# Patient Record
Sex: Female | Born: 1960 | Race: White | Hispanic: No | Smoking: Never smoker
Health system: Southern US, Community
[De-identification: ages and names within clinical notes are randomized; demographics above are authoritative.]

## PROBLEM LIST (undated history)

## (undated) DIAGNOSIS — E559 Vitamin D deficiency, unspecified: Secondary | ICD-10-CM

## (undated) DIAGNOSIS — N951 Menopausal and female climacteric states: Secondary | ICD-10-CM

## (undated) DIAGNOSIS — N200 Calculus of kidney: Secondary | ICD-10-CM

## (undated) DIAGNOSIS — I499 Cardiac arrhythmia, unspecified: Secondary | ICD-10-CM

## (undated) DIAGNOSIS — C801 Malignant (primary) neoplasm, unspecified: Secondary | ICD-10-CM

## (undated) DIAGNOSIS — T8859XA Other complications of anesthesia, initial encounter: Secondary | ICD-10-CM

## (undated) DIAGNOSIS — E7800 Pure hypercholesterolemia, unspecified: Secondary | ICD-10-CM

## (undated) DIAGNOSIS — E119 Type 2 diabetes mellitus without complications: Secondary | ICD-10-CM

## (undated) DIAGNOSIS — I471 Supraventricular tachycardia, unspecified: Secondary | ICD-10-CM

## (undated) DIAGNOSIS — I10 Essential (primary) hypertension: Secondary | ICD-10-CM

## (undated) DIAGNOSIS — R1013 Epigastric pain: Secondary | ICD-10-CM

## (undated) DIAGNOSIS — T4145XA Adverse effect of unspecified anesthetic, initial encounter: Secondary | ICD-10-CM

## (undated) HISTORY — PX: KIDNEY STONE SURGERY: SHX686

## (undated) HISTORY — DX: Pure hypercholesterolemia, unspecified: E78.00

## (undated) HISTORY — DX: Type 2 diabetes mellitus without complications: E11.9

## (undated) HISTORY — PX: GANGLION CYST EXCISION: SHX1691

## (undated) HISTORY — DX: Epigastric pain: R10.13

## (undated) HISTORY — DX: Calculus of kidney: N20.0

## (undated) HISTORY — DX: Supraventricular tachycardia, unspecified: I47.10

## (undated) HISTORY — PX: MOHS SURGERY: SHX181

## (undated) HISTORY — DX: Vitamin D deficiency, unspecified: E55.9

## (undated) HISTORY — DX: Menopausal and female climacteric states: N95.1

## (undated) HISTORY — PX: OTHER SURGICAL HISTORY: SHX169

## (undated) HISTORY — DX: Supraventricular tachycardia: I47.1

## (undated) HISTORY — PX: LAPAROSCOPY: SHX197

---

## 1998-11-11 ENCOUNTER — Other Ambulatory Visit: Admission: RE | Admit: 1998-11-11 | Discharge: 1998-11-11 | Payer: Self-pay | Admitting: Obstetrics and Gynecology

## 1999-01-19 ENCOUNTER — Other Ambulatory Visit: Admission: RE | Admit: 1999-01-19 | Discharge: 1999-01-19 | Payer: Self-pay | Admitting: Orthopedic Surgery

## 1999-11-14 ENCOUNTER — Encounter: Payer: Self-pay | Admitting: Emergency Medicine

## 1999-11-15 ENCOUNTER — Observation Stay (HOSPITAL_COMMUNITY): Admission: EM | Admit: 1999-11-15 | Discharge: 1999-11-15 | Payer: Self-pay | Admitting: Emergency Medicine

## 2000-05-19 ENCOUNTER — Other Ambulatory Visit: Admission: RE | Admit: 2000-05-19 | Discharge: 2000-05-19 | Payer: Self-pay | Admitting: Obstetrics and Gynecology

## 2000-05-23 ENCOUNTER — Encounter: Admission: RE | Admit: 2000-05-23 | Discharge: 2000-05-23 | Payer: Self-pay | Admitting: Obstetrics and Gynecology

## 2000-05-23 ENCOUNTER — Encounter: Payer: Self-pay | Admitting: Obstetrics and Gynecology

## 2001-11-01 ENCOUNTER — Encounter: Payer: Self-pay | Admitting: Obstetrics and Gynecology

## 2001-11-01 ENCOUNTER — Encounter: Admission: RE | Admit: 2001-11-01 | Discharge: 2001-11-01 | Payer: Self-pay | Admitting: Obstetrics and Gynecology

## 2002-02-13 ENCOUNTER — Encounter: Admission: RE | Admit: 2002-02-13 | Discharge: 2002-02-13 | Payer: Self-pay | Admitting: Obstetrics and Gynecology

## 2002-02-13 ENCOUNTER — Encounter: Payer: Self-pay | Admitting: Obstetrics and Gynecology

## 2002-03-08 ENCOUNTER — Other Ambulatory Visit: Admission: RE | Admit: 2002-03-08 | Discharge: 2002-03-08 | Payer: Self-pay | Admitting: Obstetrics and Gynecology

## 2003-07-24 ENCOUNTER — Other Ambulatory Visit: Admission: RE | Admit: 2003-07-24 | Discharge: 2003-07-24 | Payer: Self-pay | Admitting: Obstetrics and Gynecology

## 2003-07-29 ENCOUNTER — Encounter: Admission: RE | Admit: 2003-07-29 | Discharge: 2003-07-29 | Payer: Self-pay | Admitting: Obstetrics and Gynecology

## 2004-08-24 ENCOUNTER — Other Ambulatory Visit: Admission: RE | Admit: 2004-08-24 | Discharge: 2004-08-24 | Payer: Self-pay | Admitting: Obstetrics and Gynecology

## 2004-09-03 ENCOUNTER — Ambulatory Visit (HOSPITAL_COMMUNITY): Admission: RE | Admit: 2004-09-03 | Discharge: 2004-09-03 | Payer: Self-pay | Admitting: Obstetrics and Gynecology

## 2005-10-26 ENCOUNTER — Encounter: Admission: RE | Admit: 2005-10-26 | Discharge: 2005-10-26 | Payer: Self-pay | Admitting: Obstetrics and Gynecology

## 2005-10-26 ENCOUNTER — Other Ambulatory Visit: Admission: RE | Admit: 2005-10-26 | Discharge: 2005-10-26 | Payer: Self-pay | Admitting: Obstetrics and Gynecology

## 2006-11-21 ENCOUNTER — Encounter: Admission: RE | Admit: 2006-11-21 | Discharge: 2006-11-21 | Payer: Self-pay | Admitting: Obstetrics and Gynecology

## 2008-04-08 ENCOUNTER — Encounter: Admission: RE | Admit: 2008-04-08 | Discharge: 2008-04-08 | Payer: Self-pay | Admitting: Obstetrics and Gynecology

## 2009-04-18 ENCOUNTER — Encounter: Admission: RE | Admit: 2009-04-18 | Discharge: 2009-04-18 | Payer: Self-pay | Admitting: Obstetrics and Gynecology

## 2009-04-29 ENCOUNTER — Encounter: Admission: RE | Admit: 2009-04-29 | Discharge: 2009-04-29 | Payer: Self-pay | Admitting: Obstetrics and Gynecology

## 2009-07-30 ENCOUNTER — Inpatient Hospital Stay (HOSPITAL_COMMUNITY): Admission: RE | Admit: 2009-07-30 | Discharge: 2009-08-03 | Payer: Self-pay | Admitting: Urology

## 2010-05-24 ENCOUNTER — Encounter: Payer: Self-pay | Admitting: Obstetrics and Gynecology

## 2010-05-24 ENCOUNTER — Encounter: Payer: Self-pay | Admitting: Urology

## 2010-07-22 LAB — BASIC METABOLIC PANEL
BUN: 9 mg/dL (ref 6–23)
CO2: 28 mEq/L (ref 19–32)
CO2: 31 mEq/L (ref 19–32)
Calcium: 7.9 mg/dL — ABNORMAL LOW (ref 8.4–10.5)
Chloride: 103 mEq/L (ref 96–112)
GFR calc non Af Amer: >60 mL/min (ref >60)

## 2010-07-22 LAB — HEMOGLOBIN AND HEMATOCRIT, BLOOD
HCT: 35.7 % — ABNORMAL LOW (ref 36.0–46.0)
HCT: 38.1 % (ref 36.0–46.0)
Hemoglobin: 12.1 g/dL (ref 12.0–15.0)

## 2010-07-27 LAB — PREGNANCY, URINE: Preg Test, Ur: NEGATIVE

## 2010-07-27 LAB — BASIC METABOLIC PANEL
BUN: 11 mg/dL (ref 6–23)
CO2: 28 mEq/L (ref 19–32)
CO2: 32 mEq/L (ref 19–32)
Calcium: 8 mg/dL — ABNORMAL LOW (ref 8.4–10.5)
Chloride: 106 mEq/L (ref 96–112)
Chloride: 107 mEq/L (ref 96–112)
Creatinine, Ser: 0.66 mg/dL (ref 0.4–1.2)
Creatinine, Ser: 0.7 mg/dL (ref 0.4–1.2)
Creatinine, Ser: 0.71 mg/dL (ref 0.4–1.2)
GFR calc Af Amer: >60 mL/min (ref >60)
GFR calc Af Amer: >60 mL/min (ref >60)
GFR calc non Af Amer: >60 mL/min (ref >60)
Glucose, Bld: 78 mg/dL (ref 70–99)
Potassium: 3.7 mEq/L (ref 3.5–5.1)
Potassium: 4.2 mEq/L (ref 3.5–5.1)
Sodium: 139 mEq/L (ref 135–145)
Sodium: 139 mEq/L (ref 135–145)

## 2010-07-27 LAB — CBC
Hemoglobin: 14.5 g/dL (ref 12.0–15.0)
MCHC: 33.8 g/dL (ref 30.0–36.0)
RBC: 4.73 MIL/uL (ref 3.87–5.11)
WBC: 6.7 10*3/uL (ref 4.0–10.5)

## 2010-07-27 LAB — TYPE AND SCREEN

## 2010-07-27 LAB — HEMOGLOBIN AND HEMATOCRIT, BLOOD
HCT: 38.9 % (ref 36.0–46.0)
HCT: 39.7 % (ref 36.0–46.0)
Hemoglobin: 13.1 g/dL (ref 12.0–15.0)
Hemoglobin: 13.4 g/dL (ref 12.0–15.0)

## 2010-07-27 LAB — ABO/RH: ABO/RH(D): O POS

## 2010-09-18 NOTE — H&P (Signed)
[place]  Patient:    Dana Townsend, Dana Townsend                     MRN: [id number] Attending:  Peter M. [place], M.D. CC:         Darden Palmer., M.D.             Arvella Merles, M.D.                         History and Physical  CHIEF COMPLAINT: Palpitations.  HISTORY OF PRESENT ILLNESS: Ms. Sibrian is a 50 year old white female who presents with acute onset of tachy palpitations this evening while fixing dinner.  She denied chest pain, shortness of breath, or dizziness.  She had no nausea or vomiting.  The palpitations persisted and she presented to the emergency room, where she was found to be in atrial fibrillation with rapid ventricular response.  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  She was evaluated by Dr. Donnie Aho four years ago and had a stress test and echocardiogram at that time which were unremarkable.  She has been on no treatment.  PAST MEDICAL HISTORY:  1. PVCs.  2. Gestational diabetes.  3. Previous cesarean section.  ALLERGIES: No known drug allergies.  MEDICATIONS: Multivitamin.  SOCIAL HISTORY: She worked as a Radiation protection practitioner for 15 years and is now home schooling her children, ages three, seven, and 62.  She is married.  Her husband is a paramedic.  She denies smoking or alcohol use, or any illicit drug use.  FAMILY HISTORY: Noncontributory.  REVIEW OF SYSTEMS: Otherwise negative.  PHYSICAL EXAMINATION:  GENERAL: The patient is a pleasant, overweight white female, in no apparent distress.  VITAL SIGNS: Blood pressure 144/90, pulse 150-160 and irregular, respirations 20.  She is afebrile.  HEENT: Unremarkable.  NECK: No JVD or bruits, no thyromegaly.  LUNGS: Clear.  CARDIAC: Irregularly irregular rhythm and rate without murmurs, rubs, or gallops.  ABDOMEN: Soft, nontender.  Bowel sounds positive.  EXTREMITIES: Without edema.  Pulses 2+ and symmetric.  NEUROLOGIC: Intact.  LABORATORY DATA: ECG  demonstrates atrial fibrillation with rapid ventricular response, no acute ST-T wave changes.  Chest x-ray is negative.  IMPRESSION:  1. Atrial fibrillation with rapid ventricular response.  2. History of premature ventricular contractions with negative cardiac     work-up four years ago.  PLAN: The patient will be admitted to telemetry and we will control her rate with IV Cardizem and start her on subQ Lovenox.  Will obtain routine laboratory work including a TSH. DD:  11/15/99 TD:  11/15/99 Job: [id number]

## 2010-10-01 ENCOUNTER — Other Ambulatory Visit: Payer: Self-pay | Admitting: Obstetrics and Gynecology

## 2010-10-01 DIAGNOSIS — Z1231 Encounter for screening mammogram for malignant neoplasm of breast: Secondary | ICD-10-CM

## 2010-10-06 ENCOUNTER — Ambulatory Visit
Admission: RE | Admit: 2010-10-06 | Discharge: 2010-10-06 | Disposition: A | Discharge status: Home / Self Care | Payer: 59 | Source: Ambulatory Visit | Attending: Obstetrics and Gynecology | Admitting: Obstetrics and Gynecology

## 2010-10-06 DIAGNOSIS — Z1231 Encounter for screening mammogram for malignant neoplasm of breast: Secondary | ICD-10-CM

## 2012-01-10 ENCOUNTER — Other Ambulatory Visit: Payer: Self-pay | Admitting: Obstetrics and Gynecology

## 2012-01-10 DIAGNOSIS — R928 Other abnormal and inconclusive findings on diagnostic imaging of breast: Secondary | ICD-10-CM

## 2012-01-17 ENCOUNTER — Other Ambulatory Visit: Payer: Self-pay | Admitting: Obstetrics and Gynecology

## 2012-01-17 ENCOUNTER — Ambulatory Visit
Admission: RE | Admit: 2012-01-17 | Discharge: 2012-01-17 | Disposition: A | Discharge status: Home / Self Care | Payer: BC Managed Care – PPO | Source: Ambulatory Visit | Attending: Obstetrics and Gynecology | Admitting: Obstetrics and Gynecology

## 2012-01-17 DIAGNOSIS — R928 Other abnormal and inconclusive findings on diagnostic imaging of breast: Secondary | ICD-10-CM

## 2013-09-06 ENCOUNTER — Other Ambulatory Visit: Payer: Self-pay

## 2013-09-06 DIAGNOSIS — Z1231 Encounter for screening mammogram for malignant neoplasm of breast: Secondary | ICD-10-CM

## 2013-09-07 ENCOUNTER — Ambulatory Visit
Admission: RE | Admit: 2013-09-07 | Discharge: 2013-09-07 | Disposition: A | Discharge status: Home / Self Care | Payer: BC Managed Care – PPO | Source: Ambulatory Visit

## 2013-09-07 DIAGNOSIS — Z1231 Encounter for screening mammogram for malignant neoplasm of breast: Secondary | ICD-10-CM

## 2014-09-26 ENCOUNTER — Other Ambulatory Visit: Payer: Self-pay

## 2014-09-26 DIAGNOSIS — Z1231 Encounter for screening mammogram for malignant neoplasm of breast: Secondary | ICD-10-CM

## 2014-10-16 ENCOUNTER — Ambulatory Visit: Payer: Self-pay

## 2016-04-27 ENCOUNTER — Encounter (HOSPITAL_COMMUNITY): Payer: Self-pay | Admitting: Emergency Medicine

## 2016-04-27 ENCOUNTER — Emergency Department (HOSPITAL_COMMUNITY): Payer: BLUE CROSS/BLUE SHIELD

## 2016-04-27 ENCOUNTER — Inpatient Hospital Stay (HOSPITAL_COMMUNITY)
Admission: EM | Admit: 2016-04-27 | Discharge: 2016-04-29 | DRG: 418 | Disposition: A | Discharge status: Home / Self Care | Payer: BLUE CROSS/BLUE SHIELD | Attending: Surgery | Admitting: Surgery

## 2016-04-27 DIAGNOSIS — R000 Tachycardia, unspecified: Secondary | ICD-10-CM | POA: Diagnosis present

## 2016-04-27 DIAGNOSIS — K8000 Calculus of gallbladder with acute cholecystitis without obstruction: Secondary | ICD-10-CM | POA: Diagnosis not present

## 2016-04-27 DIAGNOSIS — Z01818 Encounter for other preprocedural examination: Secondary | ICD-10-CM

## 2016-04-27 DIAGNOSIS — I10 Essential (primary) hypertension: Secondary | ICD-10-CM | POA: Diagnosis present

## 2016-04-27 DIAGNOSIS — I700 Atherosclerosis of aorta: Secondary | ICD-10-CM | POA: Diagnosis present

## 2016-04-27 DIAGNOSIS — E119 Type 2 diabetes mellitus without complications: Secondary | ICD-10-CM | POA: Diagnosis present

## 2016-04-27 DIAGNOSIS — Z7984 Long term (current) use of oral hypoglycemic drugs: Secondary | ICD-10-CM

## 2016-04-27 DIAGNOSIS — E669 Obesity, unspecified: Secondary | ICD-10-CM | POA: Diagnosis present

## 2016-04-27 DIAGNOSIS — Z6841 Body Mass Index (BMI) 40.0 and over, adult: Secondary | ICD-10-CM

## 2016-04-27 DIAGNOSIS — R1013 Epigastric pain: Secondary | ICD-10-CM | POA: Diagnosis not present

## 2016-04-27 DIAGNOSIS — K819 Cholecystitis, unspecified: Secondary | ICD-10-CM | POA: Diagnosis present

## 2016-04-27 DIAGNOSIS — N200 Calculus of kidney: Secondary | ICD-10-CM | POA: Diagnosis present

## 2016-04-27 HISTORY — DX: Malignant (primary) neoplasm, unspecified: C80.1

## 2016-04-27 HISTORY — DX: Cardiac arrhythmia, unspecified: I49.9

## 2016-04-27 HISTORY — DX: Essential (primary) hypertension: I10

## 2016-04-27 LAB — COMPREHENSIVE METABOLIC PANEL
ALT: 20 U/L (ref 14–54)
AST: 21 U/L (ref 15–41)
Albumin: 4.2 g/dL (ref 3.5–5.0)
Alkaline Phosphatase: 64 U/L (ref 38–126)
Anion gap: 9 (ref 5–15)
BILIRUBIN TOTAL: 0.8 mg/dL (ref 0.3–1.2)
BUN: 10 mg/dL (ref 6–20)
CO2: 25 mmol/L (ref 22–32)
CREATININE: 0.8 mg/dL (ref 0.44–1.00)
Calcium: 9.2 mg/dL (ref 8.9–10.3)
Chloride: 104 mmol/L (ref 101–111)
GFR calc Af Amer: >60 mL/min (ref >60)
Glucose, Bld: 139 mg/dL — ABNORMAL HIGH (ref 65–99)
Potassium: 3.6 mmol/L (ref 3.5–5.1)
Sodium: 138 mmol/L (ref 135–145)
TOTAL PROTEIN: 7.5 g/dL (ref 6.5–8.1)

## 2016-04-27 LAB — CBC WITH DIFFERENTIAL/PLATELET
Basophils Absolute: 0 10*3/uL (ref 0.0–0.1)
Basophils Relative: 0 %
EOS PCT: 0 %
Eosinophils Absolute: 0.1 10*3/uL (ref 0.0–0.7)
HEMATOCRIT: 47.5 % — AB (ref 36.0–46.0)
HEMOGLOBIN: 16.2 g/dL — AB (ref 12.0–15.0)
LYMPHS ABS: 3.6 10*3/uL (ref 0.7–4.0)
LYMPHS PCT: 21 %
MCH: 29.9 pg (ref 26.0–34.0)
MCHC: 34.1 g/dL (ref 30.0–36.0)
MCV: 87.8 fL (ref 78.0–100.0)
Monocytes Absolute: 1.1 10*3/uL — ABNORMAL HIGH (ref 0.1–1.0)
Monocytes Relative: 6 %
Neutro Abs: 12.8 10*3/uL — ABNORMAL HIGH (ref 1.7–7.7)
Neutrophils Relative %: 73 %
PLATELETS: 267 10*3/uL (ref 150–400)
RBC: 5.41 MIL/uL — AB (ref 3.87–5.11)
RDW: 13.8 % (ref 11.5–15.5)
WBC: 17.6 10*3/uL — AB (ref 4.0–10.5)

## 2016-04-27 LAB — URINALYSIS, ROUTINE W REFLEX MICROSCOPIC
Bilirubin Urine: NEGATIVE
GLUCOSE, UA: NEGATIVE mg/dL
HGB URINE DIPSTICK: NEGATIVE
Ketones, ur: NEGATIVE mg/dL
Nitrite: NEGATIVE
Protein, ur: NEGATIVE mg/dL
SPECIFIC GRAVITY, URINE: 1.008 (ref 1.005–1.030)
pH: 9 — ABNORMAL HIGH (ref 5.0–8.0)

## 2016-04-27 LAB — I-STAT TROPONIN, ED: Troponin i, poc: 0 ng/mL (ref 0.00–0.08)

## 2016-04-27 LAB — PREGNANCY, URINE: PREG TEST UR: NEGATIVE

## 2016-04-27 LAB — LIPASE, BLOOD: Lipase: 18 U/L (ref 11–51)

## 2016-04-27 MED ORDER — IOPAMIDOL (ISOVUE-300) INJECTION 61%
INTRAVENOUS | Status: AC
  Administered 2016-04-27: 100 mL
  Filled 2016-04-27: qty 100

## 2016-04-27 MED ORDER — SODIUM CHLORIDE 0.9 % IV BOLUS (SEPSIS)
1000.0000 mL | Freq: Once | INTRAVENOUS | Status: AC
  Administered 2016-04-27: 1000 mL via INTRAVENOUS

## 2016-04-27 MED ORDER — HYDROMORPHONE HCL 2 MG/ML IJ SOLN
0.5000 mg | Freq: Once | INTRAMUSCULAR | Status: AC
  Administered 2016-04-27: 0.5 mg via INTRAVENOUS
  Filled 2016-04-27: qty 1

## 2016-04-27 MED ORDER — GI COCKTAIL ~~LOC~~
30.0000 mL | Freq: Once | ORAL | Status: AC
  Administered 2016-04-27: 30 mL via ORAL
  Filled 2016-04-27: qty 30

## 2016-04-27 NOTE — ED Triage Notes (Signed)
Pt reports mid abdominal pain since last night that is radiating to the right. Pt reports one episode of emesis last night, seven soft stools today. Denies chest pain.

## 2016-04-27 NOTE — ED Notes (Signed)
Spoke with lab to add on urine preg for CT

## 2016-04-27 NOTE — ED Provider Notes (Signed)
[facility] DEPT Provider Note   CSN: [id number] Arrival date & time: 04/27/16  1625     History   Chief Complaint Chief Complaint  Patient presents with  . Abdominal Pain    HPI Dana Townsend is a 55 y.o. female.  HPI    Patient presents with concern of abdominal pain. Symptoms began about 21 hours ago, and the patient was in her usual state of health prior to the onset. Since onset has been persistent, sore, severe, but has been migratory, from its initial location in the upper abdomen now, diffusely across the mid abdomen after being most prominent in the right upper quadrant. Since onset, no relief with multiple OTC medication, there is associated anorexia, one episode of vomiting, several loose stool, but no fever, no other chest pain, no dyspnea. Patient has history of C-section, multiple kidney surgery. Patient also has multiple life stressors.  History reviewed. No pertinent past medical history.  There are no active problems to display for this patient.   No past surgical history on file.  OB History    No data available       Home Medications    Prior to Admission medications   Not on File    Family History No family history on file.  Social History Social History  Substance Use Topics  . Smoking status: Not on file  . Smokeless tobacco: Not on file  . Alcohol use Not on file     Allergies   Patient has no known allergies.   Review of Systems Review of Systems  Constitutional:       Per HPI, otherwise negative  HENT:       Per HPI, otherwise negative  Respiratory:       Per HPI, otherwise negative  Cardiovascular:       Per HPI, otherwise negative  Gastrointestinal: Positive for nausea. Negative for vomiting.  Endocrine:       Negative aside from HPI  Genitourinary:       Neg aside from HPI   Musculoskeletal:       Per HPI, otherwise negative  Skin: Negative.   Neurological: Negative for syncope.     Physical  Exam Updated Vital Signs BP 145/88   Pulse 85   Temp 97.6 F (36.4 C)   Resp 18   SpO2 98%   Physical Exam  Constitutional: She is oriented to person, place, and time. She appears well-developed and well-nourished. No distress.  HENT:  Head: Normocephalic and atraumatic.  Eyes: Conjunctivae and EOM are normal.  Cardiovascular: Normal rate and regular rhythm.   Pulmonary/Chest: Effort normal and breath sounds normal. No stridor. No respiratory distress.  Abdominal: She exhibits no distension.  Inconsistent pain with palpation across the abdomen, with pain seemingly at its worst in the upper mid abdomen  Musculoskeletal: She exhibits no edema.  Neurological: She is alert and oriented to person, place, and time. No cranial nerve deficit.  Skin: Skin is warm and dry.  Psychiatric: She has a normal mood and affect.  Nursing note and vitals reviewed.    ED Treatments / Results  Labs (all labs ordered are listed, but only abnormal results are displayed) Labs Reviewed  COMPREHENSIVE METABOLIC PANEL - Abnormal; Notable for the following:       Result Value   Glucose, Bld 139 (*)    All other components within normal limits  URINALYSIS, ROUTINE W REFLEX MICROSCOPIC - Abnormal; Notable for the following:    APPearance HAZY (*)  pH 9.0 (*)    Leukocytes, UA TRACE (*)    Bacteria, UA RARE (*)    Squamous Epithelial / LPF 0-5 (*)    All other components within normal limits  CBC WITH DIFFERENTIAL/PLATELET - Abnormal; Notable for the following:    WBC 17.6 (*)    RBC 5.41 (*)    Hemoglobin 16.2 (*)    HCT 47.5 (*)    Neutro Abs 12.8 (*)    Monocytes Absolute 1.1 (*)    All other components within normal limits  LIPASE, BLOOD  I-STAT TROPOININ, ED    EKG  EKG Interpretation  Date/Time:  Tuesday April 27 2016 16:51:11 EST Ventricular Rate:  76 PR Interval:  148 QRS Duration: 72 QT Interval:  388 QTC Calculation: 436 R Axis:   68 Text Interpretation:  Normal sinus  rhythm T wave abnormality Artifact Abnormal ekg Confirmed by Carmin Muskrat  MD [phone]) on 04/27/2016 8:58:00 PM       Radiology Ct Abdomen Pelvis W Contrast  Result Date: 04/27/2016 CLINICAL DATA:  Acute onset of generalized abdominal pain, vomiting and diarrhea. Initial encounter. EXAM: CT ABDOMEN AND PELVIS WITH CONTRAST TECHNIQUE: Multidetector CT imaging of the abdomen and pelvis was performed using the standard protocol following bolus administration of intravenous contrast. CONTRAST:  100 mL ISOVUE-300 IOPAMIDOL (ISOVUE-300) INJECTION 61% COMPARISON:  Abdominal radiograph performed 01/25/2013, and CT of the abdomen and pelvis from 05/23/2009 FINDINGS: Lower chest: The visualized lung bases are grossly clear. The visualized portions of the mediastinum are unremarkable. Hepatobiliary: A 1.0 cm nonspecific hypodensity is noted at the medial right hepatic lobe. Gallbladder wall thickening and trace pericholecystic fluid are seen. Underlying stones are noted. Findings raise concern for acute cholecystitis. The common bile duct remains normal in caliber. Pancreas: The pancreas is within normal limits. Spleen: The spleen is unremarkable in appearance. Adrenals/Urinary Tract: The adrenal glands are unremarkable in appearance. A 1.7 cm left renal stone is noted. Bilateral extrarenal pelves are noted. There is no evidence of hydronephrosis. No ureteral stones are identified. No perinephric stranding is seen. Stomach/Bowel: The stomach is unremarkable in appearance. The small bowel is within normal limits. The appendix is normal in caliber, without evidence of appendicitis. The colon is unremarkable in appearance. Vascular/Lymphatic: Scattered calcification is seen along the abdominal aorta and its branches. The abdominal aorta is otherwise grossly unremarkable. The inferior vena cava is grossly unremarkable. No retroperitoneal lymphadenopathy is seen. No pelvic sidewall lymphadenopathy is identified.  Reproductive: The bladder is mildly distended and within normal limits. The uterus is grossly unremarkable in appearance. The ovaries are relatively symmetric. No suspicious adnexal masses are seen. Other: No additional soft tissue abnormalities are seen. Musculoskeletal: No acute osseous abnormalities are identified. The visualized musculature is unremarkable in appearance. IMPRESSION: 1. Gallbladder wall thickening and trace pericholecystic fluid, with underlying cholelithiasis. Findings raise concern for acute cholecystitis. 2. 1.0 cm nonspecific hypodensity at the medial right hepatic lobe. 3. 1.7 cm nonobstructing left renal stone noted. 4. Scattered aortic atherosclerosis. Electronically Signed   By: Garald Balding M.D.   On: 04/27/2016 23:34    Procedures Procedures (including critical care time)  Medications Ordered in ED Medications  gi cocktail (Maalox,Lidocaine,Donnatal) (30 mLs Oral Given 04/27/16 2111)  HYDROmorphone (DILAUDID) injection 0.5 mg (0.5 mg Intravenous Given 04/27/16 2111)  sodium chloride 0.9 % bolus 1,000 mL (0 mLs Intravenous Stopped 04/27/16 2316)  iopamidol (ISOVUE-300) 61 % injection (100 mLs  Contrast Given 04/27/16 2258)     Initial Impression / Assessment  and Plan / ED Course  I have reviewed the triage vital signs and the nursing notes.  Pertinent labs & imaging results that were available during my care of the patient were reviewed by me and considered in my medical decision making (see chart for details).  Clinical Course     On repeat exam the patient is in a similar condition. She is aware of all findings, including concern for acute cholecystitis. I discussed patient's case with our surgical colleagues.  11:50 PM Pain well-controlled, patient aware of all findings.  12:48 AM Patient requiring additional narcotics. Patient's disposition is pending surgical evaluation. Final Clinical Impressions(s) / ED Diagnoses  Abdominal  pain Cholecystitis    Carmin Muskrat, MD 04/28/16 [phone]

## 2016-04-27 NOTE — ED Notes (Signed)
Back from CT

## 2016-04-27 NOTE — ED Notes (Addendum)
Pt presents with mid upper right abd pain since midnight.  Various OTC heartburn/GERD interventions at home were ineffective.  Reports 6-7 soft stools, 1 episode of emesis, and nausea since then.  Recently started on metformin.  Since husband's death 1 month ago, her metoprolol dose was doubled.  Hx C-section and kidney surgery.  No other complaints.  Pt ambulated with slow, steady gait to C32 from [place].  Reports pain 4/10 at this time.  EDP at bedside.

## 2016-04-28 ENCOUNTER — Encounter (HOSPITAL_COMMUNITY): Payer: Self-pay | Admitting: *Deleted

## 2016-04-28 ENCOUNTER — Inpatient Hospital Stay (HOSPITAL_COMMUNITY): Payer: BLUE CROSS/BLUE SHIELD

## 2016-04-28 ENCOUNTER — Encounter (HOSPITAL_COMMUNITY): Admission: EM | Disposition: A | Discharge status: Home / Self Care | Payer: Self-pay | Source: Home / Self Care

## 2016-04-28 ENCOUNTER — Inpatient Hospital Stay (HOSPITAL_COMMUNITY): Payer: BLUE CROSS/BLUE SHIELD | Admitting: Anesthesiology

## 2016-04-28 DIAGNOSIS — R000 Tachycardia, unspecified: Secondary | ICD-10-CM | POA: Diagnosis present

## 2016-04-28 DIAGNOSIS — R1013 Epigastric pain: Secondary | ICD-10-CM | POA: Diagnosis present

## 2016-04-28 DIAGNOSIS — K8000 Calculus of gallbladder with acute cholecystitis without obstruction: Secondary | ICD-10-CM | POA: Diagnosis present

## 2016-04-28 DIAGNOSIS — Z6841 Body Mass Index (BMI) 40.0 and over, adult: Secondary | ICD-10-CM | POA: Diagnosis not present

## 2016-04-28 DIAGNOSIS — E119 Type 2 diabetes mellitus without complications: Secondary | ICD-10-CM | POA: Diagnosis present

## 2016-04-28 DIAGNOSIS — E669 Obesity, unspecified: Secondary | ICD-10-CM | POA: Diagnosis present

## 2016-04-28 DIAGNOSIS — I700 Atherosclerosis of aorta: Secondary | ICD-10-CM | POA: Diagnosis present

## 2016-04-28 DIAGNOSIS — N200 Calculus of kidney: Secondary | ICD-10-CM | POA: Diagnosis present

## 2016-04-28 DIAGNOSIS — I10 Essential (primary) hypertension: Secondary | ICD-10-CM | POA: Diagnosis present

## 2016-04-28 DIAGNOSIS — Z7984 Long term (current) use of oral hypoglycemic drugs: Secondary | ICD-10-CM | POA: Diagnosis not present

## 2016-04-28 DIAGNOSIS — K819 Cholecystitis, unspecified: Secondary | ICD-10-CM | POA: Diagnosis present

## 2016-04-28 HISTORY — PX: CHOLECYSTECTOMY: SHX55

## 2016-04-28 LAB — COMPREHENSIVE METABOLIC PANEL
ALBUMIN: 3.2 g/dL — AB (ref 3.5–5.0)
ALT: 16 U/L (ref 14–54)
ALT: 32 U/L (ref 14–54)
AST: 18 U/L (ref 15–41)
AST: 30 U/L (ref 15–41)
Albumin: 3.1 g/dL — ABNORMAL LOW (ref 3.5–5.0)
Alkaline Phosphatase: 53 U/L (ref 38–126)
Alkaline Phosphatase: 49 U/L (ref 38–126)
Anion gap: 10 (ref 5–15)
Anion gap: 5 (ref 5–15)
BILIRUBIN TOTAL: 0.8 mg/dL (ref 0.3–1.2)
BUN: 10 mg/dL (ref 6–20)
BUN: 11 mg/dL (ref 6–20)
CALCIUM: 8 mg/dL — AB (ref 8.9–10.3)
CHLORIDE: 104 mmol/L (ref 101–111)
CO2: 24 mmol/L (ref 22–32)
CO2: 25 mmol/L (ref 22–32)
CREATININE: 0.95 mg/dL (ref 0.44–1.00)
CREATININE: 0.71 mg/dL (ref 0.44–1.00)
Calcium: 8.2 mg/dL — ABNORMAL LOW (ref 8.9–10.3)
Chloride: 106 mmol/L (ref 101–111)
GFR calc Af Amer: >60 mL/min (ref >60)
GFR calc non Af Amer: >60 mL/min (ref >60)
GLUCOSE: 199 mg/dL — AB (ref 65–99)
Glucose, Bld: 182 mg/dL — ABNORMAL HIGH (ref 65–99)
Potassium: 3.9 mmol/L (ref 3.5–5.1)
Potassium: 3.7 mmol/L (ref 3.5–5.1)
SODIUM: 138 mmol/L (ref 135–145)
Sodium: 136 mmol/L (ref 135–145)
TOTAL PROTEIN: 6 g/dL — AB (ref 6.5–8.1)
Total Bilirubin: 0.8 mg/dL (ref 0.3–1.2)
Total Protein: 5.9 g/dL — ABNORMAL LOW (ref 6.5–8.1)

## 2016-04-28 LAB — CBG MONITORING, ED: Glucose-Capillary: 169 mg/dL — ABNORMAL HIGH (ref 65–99)

## 2016-04-28 LAB — CBC
HCT: 40.4 % (ref 36.0–46.0)
HEMATOCRIT: 40.6 % (ref 36.0–46.0)
Hemoglobin: 13.7 g/dL (ref 12.0–15.0)
Hemoglobin: 13.6 g/dL (ref 12.0–15.0)
MCH: 29.1 pg (ref 26.0–34.0)
MCH: 29.4 pg (ref 26.0–34.0)
MCHC: 33.7 g/dL (ref 30.0–36.0)
MCHC: 33.7 g/dL (ref 30.0–36.0)
MCV: 86.4 fL (ref 78.0–100.0)
MCV: 87.3 fL (ref 78.0–100.0)
PLATELETS: 250 10*3/uL (ref 150–400)
PLATELETS: 247 10*3/uL (ref 150–400)
RBC: 4.7 MIL/uL (ref 3.87–5.11)
RBC: 4.63 MIL/uL (ref 3.87–5.11)
RDW: 13.9 % (ref 11.5–15.5)
RDW: 13.9 % (ref 11.5–15.5)
WBC: 14.2 10*3/uL — ABNORMAL HIGH (ref 4.0–10.5)
WBC: 13.4 10*3/uL — AB (ref 4.0–10.5)

## 2016-04-28 LAB — GLUCOSE, CAPILLARY
GLUCOSE-CAPILLARY: 193 mg/dL — AB (ref 65–99)
GLUCOSE-CAPILLARY: 175 mg/dL — AB (ref 65–99)
GLUCOSE-CAPILLARY: 162 mg/dL — AB (ref 65–99)
GLUCOSE-CAPILLARY: 178 mg/dL — AB (ref 65–99)
Glucose-Capillary: 122 mg/dL — ABNORMAL HIGH (ref 65–99)
Glucose-Capillary: 150 mg/dL — ABNORMAL HIGH (ref 65–99)
Glucose-Capillary: 155 mg/dL — ABNORMAL HIGH (ref 65–99)

## 2016-04-28 LAB — SURGICAL PCR SCREEN
MRSA, PCR: NEGATIVE
STAPHYLOCOCCUS AUREUS: NEGATIVE

## 2016-04-28 LAB — CREATININE, SERUM
Creatinine, Ser: 0.86 mg/dL (ref 0.44–1.00)
GFR calc non Af Amer: >60 mL/min (ref >60)

## 2016-04-28 SURGERY — LAPAROSCOPIC CHOLECYSTECTOMY WITH INTRAOPERATIVE CHOLANGIOGRAM
Anesthesia: General | Site: Abdomen

## 2016-04-28 MED ORDER — MEPERIDINE HCL 25 MG/ML IJ SOLN: 6.2500 mg | INTRAMUSCULAR | Status: DC | PRN

## 2016-04-28 MED ORDER — OXYCODONE HCL 5 MG PO TABS
5.0000 mg | ORAL_TABLET | ORAL | Status: DC | PRN
  Administered 2016-04-28: 10 mg via ORAL

## 2016-04-28 MED ORDER — DIPHENHYDRAMINE HCL 25 MG PO CAPS: 25.0000 mg | ORAL_CAPSULE | Freq: Four times a day (QID) | ORAL | Status: DC | PRN

## 2016-04-28 MED ORDER — ACETAMINOPHEN 650 MG RE SUPP: 650.0000 mg | Freq: Four times a day (QID) | RECTAL | Status: DC | PRN

## 2016-04-28 MED ORDER — HYDROMORPHONE HCL 1 MG/ML IJ SOLN: 0.2500 mg | INTRAMUSCULAR | Status: DC | PRN

## 2016-04-28 MED ORDER — ENOXAPARIN SODIUM 40 MG/0.4ML ~~LOC~~ SOLN
40.0000 mg | SUBCUTANEOUS | Status: DC
  Filled 2016-04-28: qty 0.4

## 2016-04-28 MED ORDER — LIDOCAINE HCL (CARDIAC) 20 MG/ML IV SOLN
INTRAVENOUS | Status: DC | PRN
  Administered 2016-04-28: 50 mg via INTRAVENOUS

## 2016-04-28 MED ORDER — HYDROMORPHONE HCL 2 MG/ML IJ SOLN: 0.5000 mg | INTRAMUSCULAR | Status: DC | PRN

## 2016-04-28 MED ORDER — HYDROMORPHONE HCL 2 MG/ML IJ SOLN
0.5000 mg | Freq: Once | INTRAMUSCULAR | Status: AC
  Administered 2016-04-28: 0.5 mg via INTRAVENOUS
  Filled 2016-04-28: qty 1

## 2016-04-28 MED ORDER — MIDAZOLAM HCL 2 MG/2ML IJ SOLN
INTRAMUSCULAR | Status: AC
  Filled 2016-04-28: qty 2

## 2016-04-28 MED ORDER — METOPROLOL TARTRATE 5 MG/5ML IV SOLN
INTRAVENOUS | Status: DC | PRN
  Administered 2016-04-28 (×3): 1 mg via INTRAVENOUS

## 2016-04-28 MED ORDER — ACETAMINOPHEN 325 MG PO TABS
ORAL_TABLET | ORAL | Status: AC
  Administered 2016-04-28: 650 mg via ORAL
  Filled 2016-04-28: qty 2

## 2016-04-28 MED ORDER — IOPAMIDOL (ISOVUE-300) INJECTION 61%
INTRAVENOUS | Status: AC
  Filled 2016-04-28: qty 50

## 2016-04-28 MED ORDER — METOPROLOL TARTRATE 50 MG PO TABS
50.0000 mg | ORAL_TABLET | Freq: Two times a day (BID) | ORAL | Status: DC
  Administered 2016-04-29: 50 mg via ORAL
  Filled 2016-04-28 (×2): qty 1

## 2016-04-28 MED ORDER — ENOXAPARIN SODIUM 40 MG/0.4ML ~~LOC~~ SOLN: 40.0000 mg | SUBCUTANEOUS | Status: DC

## 2016-04-28 MED ORDER — FENTANYL CITRATE (PF) 100 MCG/2ML IJ SOLN
INTRAMUSCULAR | Status: DC | PRN
  Administered 2016-04-28 (×4): 50 ug via INTRAVENOUS

## 2016-04-28 MED ORDER — HYDROMORPHONE HCL 2 MG/ML IJ SOLN
0.5000 mg | INTRAMUSCULAR | Status: DC | PRN
  Administered 2016-04-28 (×4): 0.5 mg via INTRAVENOUS

## 2016-04-28 MED ORDER — LACTATED RINGERS IV SOLN: INTRAVENOUS | Status: DC

## 2016-04-28 MED ORDER — GLYCOPYRROLATE 0.2 MG/ML IJ SOLN
INTRAMUSCULAR | Status: DC | PRN
  Administered 2016-04-28: 0.6 mg via INTRAVENOUS

## 2016-04-28 MED ORDER — HYDROMORPHONE HCL 2 MG/ML IJ SOLN
INTRAMUSCULAR | Status: AC
  Administered 2016-04-28: 0.5 mg via INTRAVENOUS
  Filled 2016-04-28: qty 1

## 2016-04-28 MED ORDER — DIPHENHYDRAMINE HCL 50 MG/ML IJ SOLN: 25.0000 mg | Freq: Four times a day (QID) | INTRAMUSCULAR | Status: DC | PRN

## 2016-04-28 MED ORDER — SODIUM CHLORIDE 0.9 % IV SOLN
INTRAVENOUS | Status: DC | PRN
  Administered 2016-04-28: 10 mL

## 2016-04-28 MED ORDER — FENTANYL CITRATE (PF) 100 MCG/2ML IJ SOLN
INTRAMUSCULAR | Status: AC
  Filled 2016-04-28: qty 4

## 2016-04-28 MED ORDER — NEOSTIGMINE METHYLSULFATE 10 MG/10ML IV SOLN
INTRAVENOUS | Status: DC | PRN
  Administered 2016-04-28: 4 mg via INTRAVENOUS

## 2016-04-28 MED ORDER — ONDANSETRON 4 MG PO TBDP: 4.0000 mg | ORAL_TABLET | Freq: Four times a day (QID) | ORAL | Status: DC | PRN

## 2016-04-28 MED ORDER — ONDANSETRON HCL 4 MG/2ML IJ SOLN
INTRAMUSCULAR | Status: DC | PRN
  Administered 2016-04-28: 4 mg via INTRAVENOUS

## 2016-04-28 MED ORDER — 0.9 % SODIUM CHLORIDE (POUR BTL) OPTIME
TOPICAL | Status: DC | PRN
  Administered 2016-04-28: 1000 mL

## 2016-04-28 MED ORDER — BUPIVACAINE HCL (PF) 0.25 % IJ SOLN
INTRAMUSCULAR | Status: DC | PRN
  Administered 2016-04-28: 19 mL

## 2016-04-28 MED ORDER — OXYCODONE HCL 5 MG PO TABS
ORAL_TABLET | ORAL | Status: AC
  Administered 2016-04-28: 10 mg via ORAL
  Filled 2016-04-28: qty 2

## 2016-04-28 MED ORDER — INSULIN ASPART 100 UNIT/ML ~~LOC~~ SOLN
0.0000 [IU] | SUBCUTANEOUS | Status: DC
  Administered 2016-04-28 – 2016-04-29 (×5): 3 [IU] via SUBCUTANEOUS

## 2016-04-28 MED ORDER — LACTATED RINGERS IV SOLN
INTRAVENOUS | Status: DC | PRN
  Administered 2016-04-28: 09:00:00 via INTRAVENOUS

## 2016-04-28 MED ORDER — ACETAMINOPHEN 325 MG PO TABS
650.0000 mg | ORAL_TABLET | Freq: Four times a day (QID) | ORAL | Status: DC | PRN
  Administered 2016-04-28: 650 mg via ORAL

## 2016-04-28 MED ORDER — ONDANSETRON HCL 4 MG/2ML IJ SOLN
4.0000 mg | Freq: Four times a day (QID) | INTRAMUSCULAR | Status: DC | PRN
  Administered 2016-04-28: 4 mg via INTRAVENOUS
  Filled 2016-04-28: qty 2

## 2016-04-28 MED ORDER — SODIUM CHLORIDE 0.9 % IR SOLN
Status: DC | PRN
  Administered 2016-04-28: 1000 mL

## 2016-04-28 MED ORDER — LACTATED RINGERS IV BOLUS (SEPSIS)
1000.0000 mL | Freq: Once | INTRAVENOUS | Status: AC
  Administered 2016-04-28: 1000 mL via INTRAVENOUS

## 2016-04-28 MED ORDER — BUPIVACAINE HCL (PF) 0.25 % IJ SOLN
INTRAMUSCULAR | Status: AC
  Filled 2016-04-28: qty 30

## 2016-04-28 MED ORDER — SODIUM CHLORIDE 0.9 % IV SOLN: INTRAVENOUS | Status: DC

## 2016-04-28 MED ORDER — PIPERACILLIN-TAZOBACTAM 3.375 G IVPB
3.3750 g | Freq: Three times a day (TID) | INTRAVENOUS | Status: DC
  Administered 2016-04-28: 3.375 g via INTRAVENOUS
  Filled 2016-04-28 (×2): qty 50

## 2016-04-28 MED ORDER — PROPOFOL 10 MG/ML IV BOLUS
INTRAVENOUS | Status: DC | PRN
  Administered 2016-04-28: 150 mg via INTRAVENOUS

## 2016-04-28 MED ORDER — PROMETHAZINE HCL 25 MG/ML IJ SOLN: 6.2500 mg | INTRAMUSCULAR | Status: DC | PRN

## 2016-04-28 MED ORDER — PROPOFOL 10 MG/ML IV BOLUS
INTRAVENOUS | Status: AC
  Filled 2016-04-28: qty 20

## 2016-04-28 MED ORDER — MORPHINE SULFATE (PF) 2 MG/ML IV SOLN
1.0000 mg | INTRAVENOUS | Status: DC | PRN
  Administered 2016-04-28: 2 mg via INTRAVENOUS
  Administered 2016-04-29 (×2): 3 mg via INTRAVENOUS
  Filled 2016-04-28: qty 1
  Filled 2016-04-28 (×2): qty 2
  Filled 2016-04-28: qty 1

## 2016-04-28 MED ORDER — METOPROLOL TARTRATE 5 MG/5ML IV SOLN
INTRAVENOUS | Status: AC
  Filled 2016-04-28: qty 5

## 2016-04-28 MED ORDER — MIDAZOLAM HCL 5 MG/5ML IJ SOLN
INTRAMUSCULAR | Status: DC | PRN
  Administered 2016-04-28: 2 mg via INTRAVENOUS

## 2016-04-28 MED ORDER — ROCURONIUM BROMIDE 100 MG/10ML IV SOLN
INTRAVENOUS | Status: DC | PRN
  Administered 2016-04-28: 50 mg via INTRAVENOUS

## 2016-04-28 MED ORDER — PIPERACILLIN-TAZOBACTAM 3.375 G IVPB
3.3750 g | Freq: Once | INTRAVENOUS | Status: AC
  Administered 2016-04-28: 3.375 g via INTRAVENOUS
  Filled 2016-04-28: qty 50

## 2016-04-28 SURGICAL SUPPLY — 40 items
ADH SKN CLS APL DERMABOND .7 (GAUZE/BANDAGES/DRESSINGS) ×1
APPLIER CLIP ROT 10 11.4 M/L (STAPLE) ×3
APR CLP MED LRG 11.4X10 (STAPLE) ×1
BAG SPEC RTRVL LRG 6X4 10 (ENDOMECHANICALS) ×1
BLADE SURG ROTATE 9660 (MISCELLANEOUS) ×2 IMPLANT
CANISTER SUCTION 2500CC (MISCELLANEOUS) ×3 IMPLANT
CHLORAPREP W/TINT 26ML (MISCELLANEOUS) ×3 IMPLANT
CLIP APPLIE ROT 10 11.4 M/L (STAPLE) ×1 IMPLANT
COVER MAYO STAND STRL (DRAPES) ×3 IMPLANT
COVER SURGICAL LIGHT HANDLE (MISCELLANEOUS) ×3 IMPLANT
DERMABOND ADVANCED (GAUZE/BANDAGES/DRESSINGS) ×2
DERMABOND ADVANCED .7 DNX12 (GAUZE/BANDAGES/DRESSINGS) ×1 IMPLANT
DRAPE C-ARM 42X72 X-RAY (DRAPES) ×3 IMPLANT
ELECT REM PT RETURN 9FT ADLT (ELECTROSURGICAL) ×3
ELECTRODE REM PT RTRN 9FT ADLT (ELECTROSURGICAL) ×1 IMPLANT
GLOVE BIOGEL PI IND STRL 8 (GLOVE) ×1 IMPLANT
GLOVE BIOGEL PI INDICATOR 8 (GLOVE) ×2
GLOVE ECLIPSE 7.5 STRL STRAW (GLOVE) ×3 IMPLANT
GOWN STRL REUS W/ TWL LRG LVL3 (GOWN DISPOSABLE) ×2 IMPLANT
GOWN STRL REUS W/ TWL XL LVL3 (GOWN DISPOSABLE) ×1 IMPLANT
GOWN STRL REUS W/TWL LRG LVL3 (GOWN DISPOSABLE) ×6
GOWN STRL REUS W/TWL XL LVL3 (GOWN DISPOSABLE) ×3
KIT BASIN OR (CUSTOM PROCEDURE TRAY) ×3 IMPLANT
KIT ROOM TURNOVER OR (KITS) ×3 IMPLANT
NS IRRIG 1000ML POUR BTL (IV SOLUTION) ×3 IMPLANT
PAD ARMBOARD 7.5X6 YLW CONV (MISCELLANEOUS) ×3 IMPLANT
POUCH SPECIMEN RETRIEVAL 10MM (ENDOMECHANICALS) ×3 IMPLANT
SCISSORS LAP 5X35 DISP (ENDOMECHANICALS) ×3 IMPLANT
SET CHOLANGIOGRAPH 5 50 .035 (SET/KITS/TRAYS/PACK) ×3 IMPLANT
SET IRRIG TUBING LAPAROSCOPIC (IRRIGATION / IRRIGATOR) ×3 IMPLANT
SLEEVE ENDOPATH XCEL 5M (ENDOMECHANICALS) ×3 IMPLANT
SPECIMEN JAR SMALL (MISCELLANEOUS) ×3 IMPLANT
SUT MON AB 5-0 PS2 18 (SUTURE) ×3 IMPLANT
TOWEL OR 17X24 6PK STRL BLUE (TOWEL DISPOSABLE) ×3 IMPLANT
TOWEL OR 17X26 10 PK STRL BLUE (TOWEL DISPOSABLE) ×3 IMPLANT
TRAY LAPAROSCOPIC MC (CUSTOM PROCEDURE TRAY) ×3 IMPLANT
TROCAR XCEL BLUNT TIP 100MML (ENDOMECHANICALS) ×3 IMPLANT
TROCAR XCEL NON-BLD 11X100MML (ENDOMECHANICALS) ×3 IMPLANT
TROCAR XCEL NON-BLD 5MMX100MML (ENDOMECHANICALS) ×3 IMPLANT
TUBING INSUFFLATION (TUBING) ×3 IMPLANT

## 2016-04-28 NOTE — H&P (Signed)
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 It began as a "funny feeling" the likes of which she has experienced previously and which usually resolves with antacids. On this occasion however the pain was unrelenting despite both antacids and OTC pain medication. She was unable to sleep all night. She describes associated nausea and one episode of nonbilious, nonbloody emesis in the morning followed by 6-7 liquid bowel movements. Denies fever.  The pain began to radiate to her back and across her upper abdomen and she sought treatment at her PCP who directed her to the Er.  She noticed while in the waiting room that her ruq was tender. Here she was found to have a leukocytosis and CT concerning for cholecystitis.    She has previously had a c-section as well as a diagnostic laparoscopy for what sounds like an ectopic many years ago. She has also had several issues with kidney stones but these have been approached posteriorly.   Of note she has "diet controlled' diabetes and has had issues with stress-induced tachycardia for which she takes metoprolol. She does not see a cardiologist; this is managed by her PCP Dr. Kenton Kingfisher. EKG in the ER was unremarkable.   No Known Allergies  Past Medical History:  Diagnosis Date  . Cancer ([place])   . Hypertension     No past surgical history on file. see above  No family history on file.  Social History   Social History  . Marital status: Married    Spouse name: N/A  . Number of children: N/A  . Years of education: N/A   Social History Main Topics  . Smoking status: None  . Smokeless tobacco: None  . Alcohol use None  . Drug use: Unknown  . Sexual activity: Not Asked   Other Topics Concern  . None   Social History Narrative  . None    No current facility-administered medications on file prior to  encounter.    No current outpatient prescriptions on file prior to encounter.    Review of Systems: a complete, 10pt review of systems was completed with pertinent positives and negatives as documented in the HPI.   Physical Exam: Vitals:   04/28/16 0045 04/28/16 0100  BP: 125/81 137/70  Pulse: 62 70  Resp:    Temp:     Gen: A&Ox3, no distress  Head: normocephalic, atraumatic, EOMI, anicteric.  Neck: supple without mass or thyromegaly Chest: unlabored respirations ctab  Cardiovascular: RRR with palpable distal pulses Abdomen: obese, soft, mildly tender in epigastrium and RUQ (received dilaudid recently with much improvement) Extremities: warm, without edema, no deformities  Neuro: grossly intact Psych: appropriate mood and affect  Skin: no lesions or rashes  CBC Latest Ref Rng & Units 04/27/2016 08/02/2009 08/01/2009  WBC 4.0 - 10.5 K/uL 17.6(H) - -  Hemoglobin 12.0 - 15.0 g/dL 16.2(H) 12.1 12.7  Hematocrit 36.0 - 46.0 % 47.5(H) 35.7(L) 38.1  Platelets 150 - 400 K/uL 267 - -    CMP Latest Ref Rng & Units 04/27/2016 08/02/2009 08/01/2009  Glucose 65 - 99 mg/dL 139(H) 180(H) 120(H)  BUN 6 - 20 mg/dL 10 10 9   Creatinine 0.44 - 1.00 mg/dL 0.80 0.59 0.82  Sodium 135 - 145 mmol/L 138 136 140  Potassium 3.5 - 5.1 mmol/L 3.6 3.9 4.0  Chloride 101 - 111 mmol/L  104 103 105  CO2 22 - 32 mmol/L 25 28 31   Calcium 8.9 - 10.3 mg/dL 9.2 8.0(L) 7.9(L)  Total Protein 6.5 - 8.1 g/dL 7.5 - -  Total Bilirubin 0.3 - 1.2 mg/dL 0.8 - -  Alkaline Phos 38 - 126 U/L 64 - -  AST 15 - 41 U/L 21 - -  ALT 14 - 54 U/L 20 - -    No results found for: INR, PROTIME  Imaging: CLINICAL DATA:  Acute onset of generalized abdominal pain, vomiting and diarrhea. Initial encounter.  EXAM: CT ABDOMEN AND PELVIS WITH CONTRAST  TECHNIQUE: Multidetector CT imaging of the abdomen and pelvis was performed using the standard protocol following bolus administration of intravenous contrast.  CONTRAST:  100 mL  ISOVUE-300 IOPAMIDOL (ISOVUE-300) INJECTION 61%  COMPARISON:  Abdominal radiograph performed 01/25/2013, and CT of the abdomen and pelvis from 05/23/2009  FINDINGS: Lower chest: The visualized lung bases are grossly clear. The visualized portions of the mediastinum are unremarkable.  Hepatobiliary: A 1.0 cm nonspecific hypodensity is noted at the medial right hepatic lobe. Gallbladder wall thickening and trace pericholecystic fluid are seen. Underlying stones are noted. Findings raise concern for acute cholecystitis.  The common bile duct remains normal in caliber.  Pancreas: The pancreas is within normal limits.  Spleen: The spleen is unremarkable in appearance.  Adrenals/Urinary Tract: The adrenal glands are unremarkable in appearance.  A 1.7 cm left renal stone is noted. Bilateral extrarenal pelves are noted. There is no evidence of hydronephrosis. No ureteral stones are identified. No perinephric stranding is seen.  Stomach/Bowel: The stomach is unremarkable in appearance. The small bowel is within normal limits. The appendix is normal in caliber, without evidence of appendicitis. The colon is unremarkable in appearance.  Vascular/Lymphatic: Scattered calcification is seen along the abdominal aorta and its branches. The abdominal aorta is otherwise grossly unremarkable. The inferior vena cava is grossly unremarkable. No retroperitoneal lymphadenopathy is seen. No pelvic sidewall lymphadenopathy is identified.  Reproductive: The bladder is mildly distended and within normal limits. The uterus is grossly unremarkable in appearance. The ovaries are relatively symmetric. No suspicious adnexal masses are seen.  Other: No additional soft tissue abnormalities are seen.  Musculoskeletal: No acute osseous abnormalities are identified. The visualized musculature is unremarkable in appearance.  IMPRESSION: 1. Gallbladder wall thickening and trace  pericholecystic fluid, with underlying cholelithiasis. Findings raise concern for acute cholecystitis. 2. 1.0 cm nonspecific hypodensity at the medial right hepatic lobe. 3. 1.7 cm nonobstructing left renal stone noted. 4. Scattered aortic atherosclerosis.   Electronically Signed   By: Garald Balding M.D.   On: 04/27/2016 23:34  A/P: 55yo woman with cholecystitis. Sounds like she may have had biliary colic previously as well.  -Admit for IVF resus, symptom control and empiric abx. Will get CXR preop.  -Plan laparoscopic cholecystectomy today or tomorrow with Dr. Excell Seltzer. We discussed the nature of this operation, its risks and benefits in detail including bleeding, pain, scarring, infection. We in particular discussed low risk of injury to major structure such as the common bile duct and the implications of that, discussed low risk of conversion to open surgery. She and her daughter asked several insightful questions. They are agreeable to this plan.   Romana Juniper, MD [facility], [place] Pager [phone]

## 2016-04-28 NOTE — Discharge Instructions (Signed)
Please arrive at least 30 min before your appointment to complete your check in paperwork.  If you are unable to arrive 30 min prior to your appointment time we may have to cancel or reschedule you. ° °LAPAROSCOPIC SURGERY: POST OP INSTRUCTIONS  °1. DIET: Follow a light bland diet the first 24 hours after arrival home, such as soup, liquids, crackers, etc. Be sure to include lots of fluids daily. Avoid fast food or heavy meals as your are more likely to get nauseated. Eat a low fat the next few days after surgery.  °2. Take your usually prescribed home medications unless otherwise directed. °3. PAIN CONTROL:  °1. Pain is best controlled by a usual combination of three different methods TOGETHER:  °1. Ice/Heat °2. Over the counter pain medication °3. Prescription pain medication °2. Most patients will experience some swelling and bruising around the incisions. Ice packs or heating pads (30-60 minutes up to 6 times a day) will help. Use ice for the first few days to help decrease swelling and bruising, then switch to heat to help relax tight/sore spots and speed recovery. Some people prefer to use ice alone, heat alone, alternating between ice & heat. Experiment to what works for you. Swelling and bruising can take several weeks to resolve.  °3. It is helpful to take an over-the-counter pain medication regularly for the first few weeks. Choose one of the following that works best for you:  °1. Naproxen (Aleve, etc) Two 220mg tabs twice a day °2. Ibuprofen (Advil, etc) Three 200mg tabs four times a day (every meal & bedtime) °3. Acetaminophen (Tylenol, etc) 500-650mg four times a day (every meal & bedtime) °4. A prescription for pain medication (such as oxycodone, hydrocodone, etc) should be given to you upon discharge. Take your pain medication as prescribed.  °1. If you are having problems/concerns with the prescription medicine (does not control pain, nausea, vomiting, rash, itching, etc), please call us [phone] to see if we need to switch you to a different pain medicine that will work better for you and/or control your side effect better. °2. If you need a refill on your pain medication, please contact your pharmacy. They will contact our office to request authorization. Prescriptions will not be filled after 5 pm or on week-ends. °4. Avoid getting constipated. Between the surgery and the pain medications, it is common to experience some constipation. Increasing fluid intake and taking a fiber supplement (such as Metamucil, Citrucel, FiberCon, MiraLax, etc) 1-2 times a day regularly will usually help prevent this problem from occurring. A mild laxative (prune juice, Milk of Magnesia, MiraLax, etc) should be taken according to package directions if there are no bowel movements after 48 hours.  °5. Watch out for diarrhea. If you have many loose bowel movements, simplify your diet to bland foods & liquids for a few days. Stop any stool softeners and decrease your fiber supplement. Switching to mild anti-diarrheal medications (Kayopectate, Pepto Bismol) can help. If this worsens or does not improve, please call us. °6. Wash / shower every day. You may shower over the dressings as they are waterproof. Continue to shower over incision(s) after the dressing is off. °7. Remove your waterproof bandages 5 days after surgery. You may leave the incision open to air. You may replace a dressing/Band-Aid to cover the incision for comfort if you wish.  °8. ACTIVITIES as tolerated:  °1. You may resume regular (light) daily activities beginning the next day--such as daily self-care, walking, climbing stairs--gradually   increasing activities as tolerated. If you can walk 30 minutes without difficulty, it is safe to try more intense activity such as jogging, treadmill, bicycling, low-impact aerobics, swimming, etc. °2. Save the most intensive and strenuous activity for last such as sit-ups, heavy lifting, contact sports, etc Refrain  from any heavy lifting or straining until you are off narcotics for pain control.  °3. DO NOT PUSH THROUGH PAIN. Let pain be your guide: If it hurts to do something, don't do it. Pain is your body warning you to avoid that activity for another week until the pain goes down. °4. You may drive when you are no longer taking prescription pain medication, you can comfortably wear a seatbelt, and you can safely maneuver your car and apply brakes. °5. You may have sexual intercourse when it is comfortable.  °9. FOLLOW UP in our office  °1. Please call [facility] at [phone] to set up an appointment to see your surgeon in the office for a follow-up appointment approximately 2-3 weeks after your surgery. °2. Make sure that you call for this appointment the day you arrive home to insure a convenient appointment time. °     10. IF YOU HAVE DISABILITY OR FAMILY LEAVE FORMS, BRING THEM TO THE               OFFICE FOR PROCESSING.  ° °WHEN TO CALL US [phone]:  °1. Poor pain control °2. Reactions / problems with new medications (rash/itching, nausea, etc)  °3. Fever over 101.5 F (38.5 C) °4. Inability to urinate °5. Nausea and/or vomiting °6. Worsening swelling or bruising °7. Continued bleeding from incision. °8. Increased pain, redness, or drainage from the incision ° °The clinic staff is available to answer your questions during regular business hours (8:30am-5pm). Please don’t hesitate to call and ask to speak to one of our nurses for clinical concerns.  °If you have a medical emergency, go to the nearest emergency room or call 911.  °A surgeon from [facility] is always on call at the hospitals  ° °[facility]  °[address], [address], [place], [place] [address] ?  °MAIN: [phone] ? TOLL FREE: [phone] ?  °FAX [phone]  °[email or web address] ° °

## 2016-04-28 NOTE — Anesthesia Preprocedure Evaluation (Addendum)
Anesthesia Evaluation  Patient identified by MRN, date of birth, ID band Patient awake    Reviewed: Allergy & Precautions, NPO status , Patient's Chart, lab work & pertinent test results, reviewed documented beta blocker date and time   Airway Mallampati: III  TM Distance: >3 FB Neck ROM: Full    Dental  (+) Teeth Intact, Dental Advisory Given   Pulmonary neg pulmonary ROS,    breath sounds clear to auscultation       Cardiovascular hypertension, Pt. on medications and Pt. on home beta blockers + dysrhythmias Supra Ventricular Tachycardia  Rhythm:Regular Rate:Normal     Neuro/Psych negative neurological ROS  negative psych ROS   GI/Hepatic negative GI ROS, Neg liver ROS,   Endo/Other  diabetes, Type 2, Oral Hypoglycemic Agents  Renal/GU negative Renal ROS  negative genitourinary   Musculoskeletal negative musculoskeletal ROS (+)   Abdominal   Peds negative pediatric ROS (+)  Hematology negative hematology ROS (+)   Anesthesia Other Findings   Reproductive/Obstetrics negative OB ROS                           Lab Results  Component Value Date   WBC 14.2 (H) 04/28/2016   HGB 13.7 04/28/2016   HCT 40.6 04/28/2016   MCV 86.4 04/28/2016   PLT 250 04/28/2016   Lab Results  Component Value Date   CREATININE 0.95 04/28/2016   BUN 10 04/28/2016   NA 138 04/28/2016   K 3.9 04/28/2016   CL 104 04/28/2016   CO2 24 04/28/2016   No results found for: INR, PROTIME  04/2016 EKG: normal sinus rhythm.  Anesthesia Physical Anesthesia Plan  ASA: III  Anesthesia Plan: General   Post-op Pain Management:    Induction: Intravenous  Airway Management Planned: Oral ETT  Additional Equipment:   Intra-op Plan:   Post-operative Plan: Extubation in OR  Informed Consent: I have reviewed the patients History and Physical, chart, labs and discussed the procedure including the risks, benefits  and alternatives for the proposed anesthesia with the patient or authorized representative who has indicated his/her understanding and acceptance.   Dental advisory given  Plan Discussed with: CRNA  Anesthesia Plan Comments:         Anesthesia Quick Evaluation

## 2016-04-28 NOTE — Op Note (Signed)
Preoperative diagnosis: Cholelithiasis and acute cholecystitis  Postoperative diagnosis: Cholelithiasis and acute cholecystitis  Surgical procedure: Laparoscopic cholecystectomy with intraoperative cholangiogram  Surgeon: Marland Kitchen T. Willona Phariss M.D.  Assistant: None  Anesthesia: General Endotracheal  Complications: None  Estimated blood loss: Minimal  Description of procedure: The patient brought to the operating room, placed in the supine position on the operating table, and general endotracheal anesthesia induced. The abdomen was widely sterilely prepped and draped. The patient had received preoperative IV antibiotics and PAS were in place. Patient timeout was performed the correct procedure verified. Standard 4 port technique was used with an open Hassan cannula at the umbilicus and the remainder of the ports placed under direct vision. The gallbladder was visualized. It appeared severely thickened and edematous, and hemorrhagic at the fundus. It was decompressed with an aspiration needle. The fundus was grasped and elevated up over the liver and the infundibulum retracted inferiolaterally. Peritoneum anterior and posterior to close triangle was incised and fibrofatty tissue stripped off the neck of the gallbladder toward the porta hepatis. The distal gallbladder was thoroughly dissected. The cystic artery was identified in Calot's triangle and the cystic duct gallbladder junction dissected 360.  A good critical view was obtained. When the anatomy was clear the cystic duct was clipped at the gallbladder junction and an operative cholangiogram obtained through the cystic duct. This showed good filling of a normal common bile duct and intrahepatic ducts with free flow into the duodenum and no filling defects. Following this the cholangiocath was removed and the cystic duct was doubly clipped proximally and divided. The cystic artery was doubly clipped proximally and distally and divided. The  gallbladder was dissected free from its bed using hook cautery and removed through the umbilical port site. Complete hemostasis was obtained in the gallbladder bed. The right upper quadrant was thoroughly irrigated and hemostasis assured. Trochars were removed and all CO2 evacuated and the [facility] trocar site fascial defect closed. Skin incisions were closed with subcuticular Monocryl and Dermabond. Sponge needle and instrument counts were correct. The patient was taken to PACU in good condition.  Amariyon Maynes T  04/28/2016

## 2016-04-28 NOTE — Progress Notes (Signed)
Patient ID: Dana Townsend, female   DOB: 19-Jun-1960, 55 y.o.   MRN: [id number]       Subjective: Feels significantly better this AM, denies abd pain.    Objective: Vital signs in last 24 hours: Temp:  [97.6 F (36.4 C)-99.1 F (37.3 C)] 98.9 F (37.2 C) (12/27 0542) Pulse Rate:  [55-86] 70 (12/27 0542) Resp:  [17-18] 17 (12/27 0542) BP: (117-169)/(53-96) 121/53 (12/27 0542) SpO2:  [90 %-99 %] 96 % (12/27 0542) Weight:  [106.3 kg (234 lb 5.6 oz)] 106.3 kg (234 lb 5.6 oz) (12/27 0232) Last BM Date: 04/27/16  Intake/Output from previous day: 12/26 0701 - 12/27 0700 In: 1000 [IV Piggyback:1000] Out: 300 [Urine:300] Intake/Output this shift: No intake/output data recorded.  General appearance: alert, cooperative and no distress GI: abnormal findings:  obese and mild tenderness in the RUQ  Lab Results:   Recent Labs  04/27/16 1642 04/28/16 0428  WBC 17.6* 14.2*  HGB 16.2* 13.7  HCT 47.5* 40.6  PLT 267 250   BMET  Recent Labs  04/27/16 1642 04/28/16 0428  NA 138 138  K 3.6 3.9  CL 104 104  CO2 25 24  GLUCOSE 139* 199*  BUN 10 10  CREATININE 0.80 0.95  CALCIUM 9.2 8.2*     Studies/Results: Dg Chest 2 View  Result Date: 04/28/2016 CLINICAL DATA:  Preoperative examination (gallbladder surgery). History of hypertension. EXAM: CHEST  2 VIEW COMPARISON:  CT abdomen pelvis - 04/27/2016 FINDINGS: Normal cardiac silhouette and mediastinal contours. Atherosclerotic plaque in the thoracic aorta. Mild elevation of the right hemidiaphragm with associated right mid and lower lung linear heterogeneous opacities. No discrete focal airspace opacities. No pleural effusion or pneumothorax. No evidence of edema. Straightening and slight reversal of the expected thoracic kyphosis. IMPRESSION: 1.  No acute cardiopulmonary disease. 2. Mild elevation of the right hemidiaphragm with associated right mid and lower lung atelectasis. 3.  Aortic Atherosclerosis (ICD10-170.0)  Electronically Signed   By: Sandi Mariscal M.D.   On: 04/28/2016 07:57   Ct Abdomen Pelvis W Contrast  Result Date: 04/27/2016 CLINICAL DATA:  Acute onset of generalized abdominal pain, vomiting and diarrhea. Initial encounter. EXAM: CT ABDOMEN AND PELVIS WITH CONTRAST TECHNIQUE: Multidetector CT imaging of the abdomen and pelvis was performed using the standard protocol following bolus administration of intravenous contrast. CONTRAST:  100 mL ISOVUE-300 IOPAMIDOL (ISOVUE-300) INJECTION 61% COMPARISON:  Abdominal radiograph performed 01/25/2013, and CT of the abdomen and pelvis from 05/23/2009 FINDINGS: Lower chest: The visualized lung bases are grossly clear. The visualized portions of the mediastinum are unremarkable. Hepatobiliary: A 1.0 cm nonspecific hypodensity is noted at the medial right hepatic lobe. Gallbladder wall thickening and trace pericholecystic fluid are seen. Underlying stones are noted. Findings raise concern for acute cholecystitis. The common bile duct remains normal in caliber. Pancreas: The pancreas is within normal limits. Spleen: The spleen is unremarkable in appearance. Adrenals/Urinary Tract: The adrenal glands are unremarkable in appearance. A 1.7 cm left renal stone is noted. Bilateral extrarenal pelves are noted. There is no evidence of hydronephrosis. No ureteral stones are identified. No perinephric stranding is seen. Stomach/Bowel: The stomach is unremarkable in appearance. The small bowel is within normal limits. The appendix is normal in caliber, without evidence of appendicitis. The colon is unremarkable in appearance. Vascular/Lymphatic: Scattered calcification is seen along the abdominal aorta and its branches. The abdominal aorta is otherwise grossly unremarkable. The inferior vena cava is grossly unremarkable. No retroperitoneal lymphadenopathy is seen. No pelvic sidewall lymphadenopathy is  identified. Reproductive: The bladder is mildly distended and within normal limits.  The uterus is grossly unremarkable in appearance. The ovaries are relatively symmetric. No suspicious adnexal masses are seen. Other: No additional soft tissue abnormalities are seen. Musculoskeletal: No acute osseous abnormalities are identified. The visualized musculature is unremarkable in appearance. IMPRESSION: 1. Gallbladder wall thickening and trace pericholecystic fluid, with underlying cholelithiasis. Findings raise concern for acute cholecystitis. 2. 1.0 cm nonspecific hypodensity at the medial right hepatic lobe. 3. 1.7 cm nonobstructing left renal stone noted. 4. Scattered aortic atherosclerosis. Electronically Signed   By: Garald Balding M.D.   On: 04/27/2016 23:34    Anti-infectives: Anti-infectives    Start     Dose/Rate Route Frequency Ordered Stop   04/28/16 0800  piperacillin-tazobactam (ZOSYN) IVPB 3.375 g     3.375 g 12.5 mL/hr over 240 Minutes Intravenous Every 8 hours 04/28/16 0121     04/28/16 0100  piperacillin-tazobactam (ZOSYN) IVPB 3.375 g     3.375 g 12.5 mL/hr over 240 Minutes Intravenous  Once 04/28/16 0049 04/28/16 0528      Assessment/Plan: Cholelithiasis and acute cholecystitis. Discussed proceeding with lap chole/cholangiogram.  I discussed the procedure in detail.  We discussed the risks and benefits of a laparoscopic cholecystectomy and possible cholangiogram including, but not limited to bleeding, infection, injury to surrounding structures such as the intestine or liver, bile leak, retained gallstones, need to convert to an open procedure, prolonged diarrhea, blood clots such as  DVT, common bile duct injury, anesthesia risks, and possible need for additional procedures.  The likelihood of improvement in symptoms and return to the patient's normal status is good. We discussed the typical post-operative recovery course. She would like to proceed.     LOS: 0 days    [name] T 04/28/2016

## 2016-04-28 NOTE — Transfer of Care (Signed)
Immediate Anesthesia Transfer of Care Note  Patient: Dana Townsend  Procedure(s) Performed: Procedure(s): LAPAROSCOPIC CHOLECYSTECTOMY WITH INTRAOPERATIVE CHOLANGIOGRAM (N/A)  Patient Location: PACU  Anesthesia Type:General  Level of Consciousness: awake, alert , oriented and patient cooperative  Airway & Oxygen Therapy: Patient Spontanous Breathing and Patient connected to nasal cannula oxygen  Post-op Assessment: Report given to RN and Post -op Vital signs reviewed and stable  Post vital signs: Reviewed and stable  Last Vitals:  Vitals:   04/28/16 0542 04/28/16 0845  BP: (!) 121/53 (!) 155/69  Pulse: 70 66  Resp: 17 17  Temp: 37.2 C 36.6 C    Last Pain:  Vitals:   04/28/16 0845  TempSrc: Oral  PainSc:          Complications: No apparent anesthesia complications

## 2016-04-29 ENCOUNTER — Encounter (HOSPITAL_COMMUNITY): Payer: Self-pay | Admitting: General Surgery

## 2016-04-29 LAB — HEMOGLOBIN A1C
HEMOGLOBIN A1C: 6.9 % — AB (ref 4.8–5.6)
Mean Plasma Glucose: 151 mg/dL

## 2016-04-29 LAB — CBC
HCT: 39.9 % (ref 36.0–46.0)
Hemoglobin: 13.3 g/dL (ref 12.0–15.0)
MCH: 29.2 pg (ref 26.0–34.0)
MCHC: 33.3 g/dL (ref 30.0–36.0)
MCV: 87.5 fL (ref 78.0–100.0)
PLATELETS: 254 10*3/uL (ref 150–400)
RBC: 4.56 MIL/uL (ref 3.87–5.11)
RDW: 14 % (ref 11.5–15.5)
WBC: 13.2 10*3/uL — ABNORMAL HIGH (ref 4.0–10.5)

## 2016-04-29 LAB — GLUCOSE, CAPILLARY
GLUCOSE-CAPILLARY: 113 mg/dL — AB (ref 65–99)
Glucose-Capillary: 129 mg/dL — ABNORMAL HIGH (ref 65–99)
Glucose-Capillary: 175 mg/dL — ABNORMAL HIGH (ref 65–99)

## 2016-04-29 MED ORDER — ACETAMINOPHEN-CODEINE #3 300-30 MG PO TABS
1.0000 | ORAL_TABLET | ORAL | Status: DC | PRN
  Administered 2016-04-29: 1 via ORAL
  Filled 2016-04-29: qty 2

## 2016-04-29 MED ORDER — ACETAMINOPHEN 325 MG PO TABS: ORAL_TABLET | ORAL | Status: DC

## 2016-04-29 MED ORDER — ACETAMINOPHEN 325 MG PO TABS
650.0000 mg | ORAL_TABLET | Freq: Four times a day (QID) | ORAL | Status: DC | PRN
  Administered 2016-04-29: 325 mg via ORAL
  Filled 2016-04-29: qty 2

## 2016-04-29 MED ORDER — METFORMIN HCL 500 MG PO TABS: ORAL_TABLET | ORAL | Status: DC

## 2016-04-29 MED ORDER — ACETAMINOPHEN-CODEINE #3 300-30 MG PO TABS: 1.0000 | ORAL_TABLET | ORAL | 0 refills | Status: DC | PRN

## 2016-04-29 NOTE — Anesthesia Postprocedure Evaluation (Signed)
Anesthesia Post Note  Patient: Dana Townsend  Procedure(s) Performed: Procedure(s) (LRB): LAPAROSCOPIC CHOLECYSTECTOMY WITH INTRAOPERATIVE CHOLANGIOGRAM (N/A)  Patient location during evaluation: PACU Anesthesia Type: General Level of consciousness: awake and alert Pain management: pain level controlled Vital Signs Assessment: post-procedure vital signs reviewed and stable Respiratory status: spontaneous breathing, nonlabored ventilation, respiratory function stable and patient connected to nasal cannula oxygen Cardiovascular status: blood pressure returned to baseline and stable Postop Assessment: no signs of nausea or vomiting Anesthetic complications: no       Last Vitals:  Vitals:   04/29/16 0239 04/29/16 0452  BP: (!) 147/58 134/63  Pulse: 71 77  Resp: 18 16  Temp: 36.9 C 37 C    Last Pain:  Vitals:   04/29/16 0452  TempSrc: Oral  PainSc:                  Effie Berkshire

## 2016-04-29 NOTE — Progress Notes (Signed)
Pt is tolerating regular diet. Pain is controlled with oral pain meds. Ambulating in the hall. Would like to go home today. Discharge instructions given to pt, verbalized understanding. Discharged to home accompanied by daughter.

## 2016-04-29 NOTE — Progress Notes (Signed)
1 Day Post-Op  Subjective: Still having some pain and taking MS.  Her sites look fine, she has been up to void, IV is already down to [facility].  Objective: Vital signs in last 24 hours: Temp:  [97.9 F (36.6 C)-98.6 F (37 C)] 98.6 F (37 C) (12/28 0452) Pulse Rate:  [59-82] 77 (12/28 0452) Resp:  [11-25] 16 (12/28 0452) BP: (108-155)/(56-79) 134/63 (12/28 0452) SpO2:  [95 %-100 %] 95 % (12/28 0452) Last BM Date: 04/27/16 420 PO 600 IV fluid recorded 1575 urine Afebrile, VSS WBC still 13.2 Glucose OK Intake/Output from previous day: 12/27 0701 - 12/28 0700 In: 1020 [P.O.:420; I.V.:600] Out: 1615 [Urine:1575; Blood:40] Intake/Output this shift: No intake/output data recorded.  General appearance: alert, cooperative and no distress Resp: clear to auscultation bilaterally GI: soft, sore, sites all look good  Lab Results:   Recent Labs  04/28/16 1517 04/29/16 0255  WBC 13.4* 13.2*  HGB 13.6 13.3  HCT 40.4 39.9  PLT 247 254    BMET  Recent Labs  04/28/16 0428 04/28/16 1517 04/28/16 1611  NA 138  --  136  K 3.9  --  3.7  CL 104  --  106  CO2 24  --  25  GLUCOSE 199*  --  182*  BUN 10  --  11  CREATININE 0.95 0.86 0.71  CALCIUM 8.2*  --  8.0*   PT/INR No results for input(s): LABPROT, INR in the last 72 hours.   Recent Labs Lab 04/27/16 1642 04/28/16 0428 04/28/16 1611  AST 21 18 30   ALT 20 16 32  ALKPHOS 64 53 49  BILITOT 0.8 0.8 0.8  PROT 7.5 5.9* 6.0*  ALBUMIN 4.2 3.2* 3.1*     Lipase     Component Value Date/Time   LIPASE 18 04/27/2016 1642     Studies/Results: Dg Chest 2 View  Result Date: 04/28/2016 CLINICAL DATA:  Preoperative examination (gallbladder surgery). History of hypertension. EXAM: CHEST  2 VIEW COMPARISON:  CT abdomen pelvis - 04/27/2016 FINDINGS: Normal cardiac silhouette and mediastinal contours. Atherosclerotic plaque in the thoracic aorta. Mild elevation of the right hemidiaphragm with associated right mid and lower lung  linear heterogeneous opacities. No discrete focal airspace opacities. No pleural effusion or pneumothorax. No evidence of edema. Straightening and slight reversal of the expected thoracic kyphosis. IMPRESSION: 1.  No acute cardiopulmonary disease. 2. Mild elevation of the right hemidiaphragm with associated right mid and lower lung atelectasis. 3.  Aortic Atherosclerosis (ICD10-170.0) Electronically Signed   By: Sandi Mariscal M.D.   On: 04/28/2016 07:57   Dg Cholangiogram Operative  Result Date: 04/28/2016 CLINICAL DATA:  Cholecystitis.  Intraoperative cholangiogram. EXAM: INTRAOPERATIVE CHOLANGIOGRAM FLUOROSCOPY TIME:  20 seconds COMPARISON:  CT abdomen and pelvis - 04/27/2016 FINDINGS: Intraoperative cholangiographic images of the right upper abdominal quadrant during laparoscopic cholecystectomy are provided for review. Surgical clips overlie the expected location of the gallbladder fossa. Contrast injection demonstrates selective cannulation of the central aspect of the cystic duct. There is passage of contrast through the central aspect of the cystic duct with filling of a non dilated common bile duct. There is passage of contrast though the CBD and into the descending portion of the duodenum. There is minimal reflux of injected contrast into the common hepatic duct and central aspect of the non dilated intrahepatic biliary system. There are no discrete filling defects within the opacified portions of the biliary system to suggest the presence of choledocholithiasis. IMPRESSION: No evidence of choledocholithiasis. Electronically Signed  By: Sandi Mariscal M.D.   On: 04/28/2016 10:45   Ct Abdomen Pelvis W Contrast  Result Date: 04/27/2016 CLINICAL DATA:  Acute onset of generalized abdominal pain, vomiting and diarrhea. Initial encounter. EXAM: CT ABDOMEN AND PELVIS WITH CONTRAST TECHNIQUE: Multidetector CT imaging of the abdomen and pelvis was performed using the standard protocol following bolus  administration of intravenous contrast. CONTRAST:  100 mL ISOVUE-300 IOPAMIDOL (ISOVUE-300) INJECTION 61% COMPARISON:  Abdominal radiograph performed 01/25/2013, and CT of the abdomen and pelvis from 05/23/2009 FINDINGS: Lower chest: The visualized lung bases are grossly clear. The visualized portions of the mediastinum are unremarkable. Hepatobiliary: A 1.0 cm nonspecific hypodensity is noted at the medial right hepatic lobe. Gallbladder wall thickening and trace pericholecystic fluid are seen. Underlying stones are noted. Findings raise concern for acute cholecystitis. The common bile duct remains normal in caliber. Pancreas: The pancreas is within normal limits. Spleen: The spleen is unremarkable in appearance. Adrenals/Urinary Tract: The adrenal glands are unremarkable in appearance. A 1.7 cm left renal stone is noted. Bilateral extrarenal pelves are noted. There is no evidence of hydronephrosis. No ureteral stones are identified. No perinephric stranding is seen. Stomach/Bowel: The stomach is unremarkable in appearance. The small bowel is within normal limits. The appendix is normal in caliber, without evidence of appendicitis. The colon is unremarkable in appearance. Vascular/Lymphatic: Scattered calcification is seen along the abdominal aorta and its branches. The abdominal aorta is otherwise grossly unremarkable. The inferior vena cava is grossly unremarkable. No retroperitoneal lymphadenopathy is seen. No pelvic sidewall lymphadenopathy is identified. Reproductive: The bladder is mildly distended and within normal limits. The uterus is grossly unremarkable in appearance. The ovaries are relatively symmetric. No suspicious adnexal masses are seen. Other: No additional soft tissue abnormalities are seen. Musculoskeletal: No acute osseous abnormalities are identified. The visualized musculature is unremarkable in appearance. IMPRESSION: 1. Gallbladder wall thickening and trace pericholecystic fluid, with  underlying cholelithiasis. Findings raise concern for acute cholecystitis. 2. 1.0 cm nonspecific hypodensity at the medial right hepatic lobe. 3. 1.7 cm nonobstructing left renal stone noted. 4. Scattered aortic atherosclerosis. Electronically Signed   By: Garald Balding M.D.   On: 04/27/2016 23:34   Prior to Admission medications   Medication Sig Start Date End Date Taking? Authorizing Provider  Coenzyme Q10 (COQ10 PO) Take 1 tablet by mouth daily.    Yes Historical Provider, MD  MAGNESIUM PO Take 1 tablet by mouth daily.    Yes Historical Provider, MD  metFORMIN (GLUCOPHAGE) 500 MG tablet Take 500 mg by mouth at bedtime.   Yes Historical Provider, MD  metoprolol (LOPRESSOR) 50 MG tablet Take 50 mg by mouth 2 (two) times daily.   Yes Historical Provider, MD  Multiple Vitamin (MULTIVITAMIN) capsule Take 1 capsule by mouth daily.   Yes Historical Provider, MD  Multiple Vitamins-Minerals (EYE VITAMINS PO) Take by mouth.   Yes Historical Provider, MD  omega-3 acid ethyl esters (LOVAZA) 1 g capsule Take 1 g by mouth 2 (two) times daily.    Yes Historical Provider, MD  PRASTERONE, DHEA, PO Take 1 tablet by mouth daily.    Yes Historical Provider, MD  Probiotic Product (PROBIOTIC PO) Take 1 tablet by mouth daily.    Yes Historical Provider, MD  vitamin B-12 (CYANOCOBALAMIN) 100 MCG tablet Take 100 mcg by mouth daily.   Yes Historical Provider, MD    Medications: . enoxaparin (LOVENOX) injection  40 mg Subcutaneous Q24H  . insulin aspart  0-15 Units Subcutaneous Q4H  . metoprolol  50 mg Oral BID     Assessment/Plan Cholelithiasis and acute cholecystitis s/p Laparoscopic cholecystectomy with IOC, 04/28/16, Dr. Excell Seltzer AODM - Glucophage Stress induced tachycardia - Lopressor FEN:  Carb MOd ID:  Zosyn x 2 doses DVT:  Lovenox  Plan:  She likes Tylenol with Codeine for pain.  I will switch her to that, mobilize her and if OK home after lunch.  Restart Glucophage this PM.      LOS: 1 day     Cyrstal Leitz 04/29/2016 [phone]

## 2016-05-02 NOTE — Discharge Summary (Signed)
Physician Discharge Summary  Patient ID: Dana Townsend MRN: [id number] DOB/AGE: 1960/05/30 55 y.o.  Admit date: 04/27/2016 Discharge date: 05/02/2016  Admission Diagnoses:  Cholelithiasis and acute cholecystitis AODM - Glucophage Stress induced tachycardia - Lopressor  Discharge Diagnoses:  Cholelithiasis and acute cholecystitis s/p Laparoscopic cholecystectomy with IOC, 04/28/16, Dr. Excell Seltzer AODM - Glucophage Stress induced tachycardia - Lopressor   Active Problems:   Cholecystitis   PROCEDURES: s/p Laparoscopic cholecystectomy with IOC, 04/28/16, Dr. 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 It began as a "funny feeling" the likes of which she has experienced previously and which usually resolves with antacids. On this occasion however the pain was unrelenting despite both antacids and OTC pain medication. She was unable to sleep all night. She describes associated nausea and one episode of nonbilious, nonbloody emesis in the morning followed by 6-7 liquid bowel movements. Denies fever.  The pain began to radiate to her back and across her upper abdomen and she sought treatment at her PCP who directed her to the Er.  She noticed while in the waiting room that her ruq was tender. Here she was found to have a leukocytosis and CT concerning for cholecystitis.    She has previously had a c-section as well as a diagnostic laparoscopy for what sounds like an ectopic many years ago. She has also had several issues with kidney stones but these have been approached posteriorly.   Of note she has "diet controlled' diabetes and has had issues with stress-induced tachycardia for which she takes metoprolol. She does not see a cardiologist; this is managed by her PCP Dr. Kenton Kingfisher. EKG in the ER was unremarkable.  She was seen and admitted taken to the  OR the following day for surgery.  She still felt bad in the early AM, but by lunch time she was better and ready to go home.    CBC Latest Ref Rng & Units 04/29/2016 04/28/2016 04/28/2016  WBC 4.0 - 10.5 K/uL 13.2(H) 13.4(H) 14.2(H)  Hemoglobin 12.0 - 15.0 g/dL 13.3 13.6 13.7  Hematocrit 36.0 - 46.0 % 39.9 40.4 40.6  Platelets 150 - 400 K/uL 254 247 250   CMP Latest Ref Rng & Units 04/28/2016 04/28/2016 04/28/2016  Glucose 65 - 99 mg/dL 182(H) - 199(H)  BUN 6 - 20 mg/dL 11 - 10  Creatinine 0.44 - 1.00 mg/dL 0.71 0.86 0.95  Sodium 135 - 145 mmol/L 136 - 138  Potassium 3.5 - 5.1 mmol/L 3.7 - 3.9  Chloride 101 - 111 mmol/L 106 - 104  CO2 22 - 32 mmol/L 25 - 24  Calcium 8.9 - 10.3 mg/dL 8.0(L) - 8.2(L)  Total Protein 6.5 - 8.1 g/dL 6.0(L) - 5.9(L)  Total Bilirubin 0.3 - 1.2 mg/dL 0.8 - 0.8  Alkaline Phos 38 - 126 U/L 49 - 53  AST 15 - 41 U/L 30 - 18  ALT 14 - 54 U/L 32 - 16   Condition on D/C:  Improved  Disposition: 01-Home or Self Care   Allergies as of 04/29/2016      Reactions   Dilaudid [hydromorphone Hcl] Other (See Comments)   "made me feel out of it" pt states--didn't like the way it made her feel      Medication List    TAKE these medications   acetaminophen 325 MG tablet Commonly known as:  TYLENOL  You may use plain Tylenol, or Tylenol 3 for pain every 4 hours as needed. Do not take more than 4000 mg of Tylenol per day.   acetaminophen-codeine 300-30 MG tablet Commonly known as:  TYLENOL #3 Take 1-2 tablets by mouth every 4 (four) hours as needed for moderate pain.   COQ10 PO Take 1 tablet by mouth daily.   EYE VITAMINS PO Take by mouth.   MAGNESIUM PO Take 1 tablet by mouth daily.   metFORMIN 500 MG tablet Commonly known as:  GLUCOPHAGE You can resume metformin this p.m. at supper. Continue to take it as you did before admission. What changed:  how much to take  how to take this  when to take this  additional instructions   metoprolol 50 MG  tablet Commonly known as:  LOPRESSOR Take 50 mg by mouth 2 (two) times daily.   multivitamin capsule Take 1 capsule by mouth daily.   omega-3 acid ethyl esters 1 g capsule Commonly known as:  LOVAZA Take 1 g by mouth 2 (two) times daily.   PRASTERONE (DHEA) PO Take 1 tablet by mouth daily.   PROBIOTIC PO Take 1 tablet by mouth daily.   vitamin B-12 100 MCG tablet Commonly known as:  CYANOCOBALAMIN Take 100 mcg by mouth daily.      Follow-up [facility], [place]. Call on 05/18/2016.   Specialty:  General Surgery Why:  Your appointment is at 10 AM be either 30 minutes early for check-in. Bring picture ID and insurance information. Contact information: [address] [place], [place] Follow up.   Specialty:  Family Medicine Why:  Follow up for Medical management issues, let him know you had surgery. Contact information: [place] [address] [phone]           Signed: Earnstine Regal 05/02/2016, 10:23 AM

## 2017-04-21 ENCOUNTER — Telehealth: Payer: Self-pay

## 2017-04-21 NOTE — Telephone Encounter (Signed)
SENT NOTES TO SCHEDULING 

## 2017-04-21 NOTE — Telephone Encounter (Signed)
ERROR

## 2017-04-28 ENCOUNTER — Encounter: Payer: Self-pay | Admitting: *Deleted

## 2017-05-19 ENCOUNTER — Ambulatory Visit (INDEPENDENT_AMBULATORY_CARE_PROVIDER_SITE_OTHER): Payer: Commercial Managed Care - PPO

## 2017-05-19 ENCOUNTER — Ambulatory Visit (INDEPENDENT_AMBULATORY_CARE_PROVIDER_SITE_OTHER): Payer: Commercial Managed Care - PPO | Admitting: Cardiology

## 2017-05-19 ENCOUNTER — Encounter: Payer: Self-pay | Admitting: Cardiology

## 2017-05-19 VITALS — BP 124/88 | HR 58 | Ht 63.5 in | Wt 200.0 lb

## 2017-05-19 DIAGNOSIS — R55 Syncope and collapse: Secondary | ICD-10-CM

## 2017-05-19 DIAGNOSIS — R42 Dizziness and giddiness: Secondary | ICD-10-CM

## 2017-05-19 DIAGNOSIS — R002 Palpitations: Secondary | ICD-10-CM

## 2017-05-19 NOTE — Patient Instructions (Signed)
Medication Instructions:  Your physician recommends that you continue on your current medications as directed. Please refer to the Current Medication list given to you today.  * If you need a refill on your cardiac medications before your next appointment, please call your pharmacy. *  Labwork: None ordered  Testing/Procedures: Your physician has recommended that you wear an event monitor. Event monitors are medical devices that record the heart's electrical activity. Doctors most often [place] these monitors to diagnose arrhythmias. Arrhythmias are problems with the speed or rhythm of the heartbeat. The monitor is a small, portable device. You can wear one while you do your normal daily activities. This is usually used to diagnose what is causing palpitations/syncope (passing out).  Follow-Up: Your physician recommends that you schedule a follow-up appointment in: 6 weeks with Dr. Curt Bears. - after your event monitor has been completed.  Thank you for choosing [facility]!!   Trinidad Curet, RN [phone]  Any Other Special Instructions Will Be Listed Below (If Applicable).   Cardiac Event Monitoring A cardiac event monitor is a small recording device that is used to detect abnormal heart rhythms (arrhythmias). The monitor is used to record your heart rhythm when you have symptoms, such as:  Fast heartbeats (palpitations), such as heart racing or fluttering.  Dizziness.  Fainting or light-headedness.  Unexplained weakness.  Some monitors are wired to electrodes placed on your chest. Electrodes are flat, sticky disks that attach to your skin. Other monitors may be hand-held or worn on the wrist. The monitor can be worn for up to 30 days. If the monitor is attached to your chest, a technician will prepare your chest for the electrode placement and show you how to work the monitor. Take time to practice using the monitor before you leave the office. Make sure you understand how to  send the information from the monitor to your health care provider. In some cases, you may need to use a landline telephone instead of a cell phone. What are the risks? Generally, this device is safe to use, but it possible that the skin under the electrodes will become irritated. How to use your cardiac event monitor  Wear your monitor at all times, except when you are in water: ? Do not let the monitor get wet. ? Take the monitor off when you bathe. Do not swim or use a hot tub with it on.  Keep your skin clean. Do not put body lotion or moisturizer on your chest.  Change the electrodes as told by your health care provider or any time they stop sticking to your skin. You may need to use medical tape to keep them on.  Try to put the electrodes in slightly different places on your chest to help prevent skin irritation. They must remain in the area under your left breast and in the upper right section of your chest.  Make sure the monitor is safely clipped to your clothing or in a location close to your body that your health care provider recommends.  Press the button to record as soon as you feel heart-related symptoms, such as: ? Dizziness. ? Weakness. ? Light-headedness. ? Palpitations. ? Thumping or pounding in your chest. ? Shortness of breath. ? Unexplained weakness.  Keep a diary of your activities, such as walking, doing chores, and taking medicine. It is very important to note what you were doing when you pushed the button to record your symptoms. This will help your health care provider determine  what might be contributing to your symptoms.  Send the recorded information as recommended by your health care provider. It may take some time for your health care provider to process the results.  Change the batteries as told by your health care provider.  Keep electronic devices away from your monitor. This includes: ? Tablets. ? MP3 players. ? Cell phones.  While wearing your  monitor you should avoid: ? Electric blankets. ? Armed forces operational officer. ? Electric toothbrushes. ? Microwave ovens. ? Magnets. ? Metal detectors. Get help right away if:  You have chest pain.  You have extreme difficulty breathing or shortness of breath.  You develop a very fast heartbeat that persists.  You develop dizziness that does not go away.  You faint or constantly feel like you are about to faint. Summary  A cardiac event monitor is a small recording device that is used to help detect abnormal heart rhythms (arrhythmias).  The monitor is used to record your heart rhythm when you have heart-related symptoms.  Make sure you understand how to send the information from the monitor to your health care provider.  It is important to press the button on the monitor when you have any heart-related symptoms.  Keep a diary of your activities, such as walking, doing chores, and taking medicine. It is very important to note what you were doing when you pushed the button to record your symptoms. This will help your health care provider learn what might be causing your symptoms. This information is not intended to replace advice given to you by your health care provider. Make sure you discuss any questions you have with your health care provider. Document Released: 01/27/2008 Document Revised: 04/03/2016 Document Reviewed: 04/03/2016 Elsevier Interactive Patient Education  2017 [employer].

## 2017-05-19 NOTE — Progress Notes (Signed)
Electrophysiology Office Note   Date:  05/19/2017   ID:  Dana Townsend, DOB [birth year], MRN [id number]  PCP:  Dana Frees, MD  Cardiologist:   Primary Electrophysiologist:  Dana Meredith Leeds, MD    Chief Complaint  Patient presents with  . New Patient (Initial Visit)    Pre-syncope     History of Present Illness: Dana Townsend is a 57 y.o. female who is being seen today for the evaluation of presyncope at the request of Dana Frees, MD. Presenting today for electrophysiology evaluation.  She has a history of diabetes, hyperlipidemia, vitamin D deficiency, and palpitations.  She possibly has SVT versus multifocal atrial tachycardia in the past as per chart review.    Today, she denies symptoms of chest pain, shortness of breath, orthopnea, PND, lower extremity edema, claudication, dizziness, presyncope, syncope, bleeding, or neurologic sequela. The patient is tolerating medications without difficulties.  She has been having palpitations for many years.  She takes metoprolol and this has helped to control her palpitations.  She is also had 3 episodes of near syncope since Thanksgiving.  She said that she had to sit down and was quite dizzy.  She noted both tachycardia and bradycardia at the time.  She says that she has had SVT, PVCs, and possibly atrial fibrillation in the past.  During the episode on Thanksgiving day, she checked her pulse radially which was difficult to palpate.   Past Medical History:  Diagnosis Date  . Cancer ([place])   . Diabetes mellitus type II, controlled, with no complications ([place])   . Dyspepsia   . Dysrhythmia    runs SVT/VT   WITH ANXIETY     . Hypertension   . Kidney stones   . Menopausal symptoms   . Pure hypercholesterolemia   . Supraventricular tachycardia ([place])   . Vitamin D deficiency    Past Surgical History:  Procedure Laterality Date  . CESAREAN SECTION    . CHOLECYSTECTOMY N/A 04/28/2016   Procedure: LAPAROSCOPIC  CHOLECYSTECTOMY WITH INTRAOPERATIVE CHOLANGIOGRAM;  Surgeon: Excell Seltzer, MD;  Location: [facility];  Service: General;  Laterality: N/A;  . GANGLION CYST EXCISION    . HISTORY KIDNEY STONES    . KIDNEY STONE SURGERY     left and right  . LAPAROSCOPY    . MOHS SURGERY     FACE     Current Outpatient Medications  Medication Sig Dispense Refill  . acetaminophen (TYLENOL) 325 MG tablet You may use plain Tylenol, or Tylenol 3 for pain every 4 hours as needed. Do not take more than 4000 mg of Tylenol per day.    Marland Kitchen acetaminophen-codeine (TYLENOL #3) 300-30 MG tablet Take 1-2 tablets by mouth every 4 (four) hours as needed for moderate pain. 30 tablet 0  . Ascorbic Acid (VITAMIN C) 1000 MG tablet Take 1,000 mg by mouth daily.    . Calcium Carb-Cholecalciferol (CALCIUM 600+D3 PO) 1 tbs by mouth daily    . Cholecalciferol (VITAMIN D PO) Take 5,000 Units by mouth daily.    . Coenzyme Q10 (COQ10 PO) Take 1 tablet by mouth daily.     Marland Kitchen MAGNESIUM PO Take 1 tablet by mouth daily.     . metoprolol tartrate (LOPRESSOR) 25 MG tablet Take 25 mg by mouth 2 (two) times daily.    . Multiple Vitamin (MULTIVITAMIN) capsule Take 1 capsule by mouth daily.    . Multiple Vitamins-Minerals (EYE VITAMINS PO) Take by mouth.    . NON FORMULARY Take 1  capsule by mouth daily. NutriDyn    . NON FORMULARY Take 1 capsule by mouth daily. K2-7    . NON FORMULARY Take 1 capsule by mouth daily. Serrapetase    . omega-3 acid ethyl esters (LOVAZA) 1 g capsule Take 1 g by mouth 2 (two) times daily.     Marland Kitchen PRASTERONE, DHEA, PO Take 1 tablet by mouth daily.     . Probiotic Product (PROBIOTIC PO) Take 1 tablet by mouth daily.     . Turmeric 500 MG TABS Take 1 capsule by mouth daily.    . vitamin B-12 (CYANOCOBALAMIN) 100 MCG tablet Take 100 mcg by mouth daily.    . vitamin E 1000 UNIT capsule Take 1,000 Units by mouth daily.     No current facility-administered medications for this visit.     Allergies:   Epinephrine;  Erythromycin base; and Dilaudid [hydromorphone hcl]   Social History:  The patient  reports that  has never smoked. she has never used smokeless tobacco. She reports that she does not drink alcohol or use drugs.   Family History:  The patient's family history includes Adrenal disorder in her mother; Alzheimer's disease in her father; Hypertension in her father; Kidney cancer in her father.    ROS:  Please see the history of present illness.   Otherwise, review of systems is positive for palpitations, dizziness.   All other systems are reviewed and negative.    PHYSICAL EXAM: VS:  BP 124/88   Pulse (!) 58   Ht 5' 3.5" (1.613 m)   Wt 200 lb (90.7 kg)   BMI 34.87 kg/m  , BMI Body mass index is 34.87 kg/m. GEN: Well nourished, well developed, in no acute distress  HEENT: normal  Neck: no JVD, carotid bruits, or masses Cardiac: RRR; no murmurs, rubs, or gallops,no edema  Respiratory:  clear to auscultation bilaterally, normal work of breathing GI: soft, nontender, nondistended, + BS MS: no deformity or atrophy  Skin: warm and dry Neuro:  Strength and sensation are intact Psych: euthymic mood, full affect  EKG:  EKG is ordered today. Personal review of the ekg ordered shows sinus rhythm, rate 58  Recent Labs: No results found for requested labs within last 8760 hours.    Lipid Panel  No results found for: CHOL, TRIG, HDL, CHOLHDL, VLDL, LDLCALC, LDLDIRECT   Wt Readings from Last 3 Encounters:  05/19/17 200 lb (90.7 kg)  04/28/16 234 lb 5.6 oz (106.3 kg)      Other studies Reviewed: Additional studies/ records that were reviewed today include: PCP notes  ASSESSMENT AND PLAN:  1.  Palpitations: The cause of her palpitations.  She does say that she has seen SVT, atrial fibrillation, and PVCs in the past.  Due to her symptoms of near syncope, we Dana plan for a 30-day monitor.  2.  Presyncope: Plan for 30-day monitor.  It appears that her episodes of syncope might be due to  either tachycardias or bradycardia.  3.  Hypertension: Well-controlled today.  No changes.  Current medicines are reviewed at length with the patient today.   The patient does not have concerns regarding her medicines.  The following changes were made today:  none  Labs/ tests ordered today include:  Orders Placed This Encounter  Procedures  . Cardiac event monitor  . EKG 12-Lead     Disposition:   FU with Dana Camnitz 6 weeks  Signed, Dana Meredith Leeds, MD  05/19/2017 9:47 AM  [place] [address] [address] [phone] (office) [phone] (fax)

## 2017-06-03 IMAGING — CT CT ABD-PELV W/ CM
2 of 5 series · 15 of 46 positions shown, 17 images · IV contrast (iopamidol)
Comparison: Abdominal radiograph performed 01/25/2013, and CT of
the abdomen and pelvis from 05/23/2009

CLINICAL DATA: Acute onset of generalized abdominal pain, vomiting
and diarrhea. Initial encounter.

EXAM:
CT ABDOMEN AND PELVIS WITH CONTRAST
TECHNIQUE: Multidetector CT imaging of the abdomen and pelvis was performed
using the standard protocol following bolus administration of
intravenous contrast.
CONTRAST:  100 mL [id number] IOPAMIDOL ([id number]) INJECTION 61%

[Series 2: abd/ pelvis 5.0 i30f 1 · axial · 0.88mm/px · z∈[+717,+1142]mm · 12 of 97 slices shown, 14 images]
[im 6/97  soft-tissue]
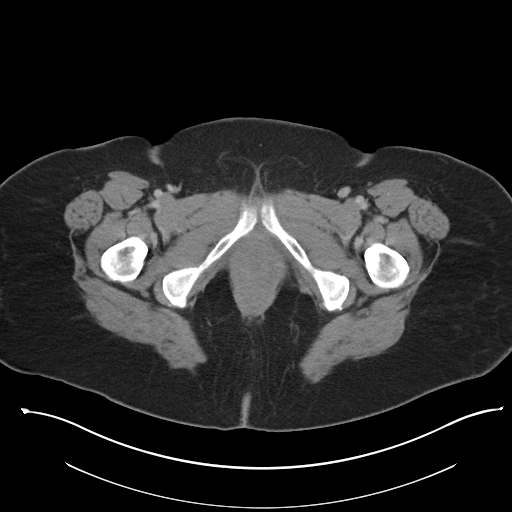
[im 6/97  bone]
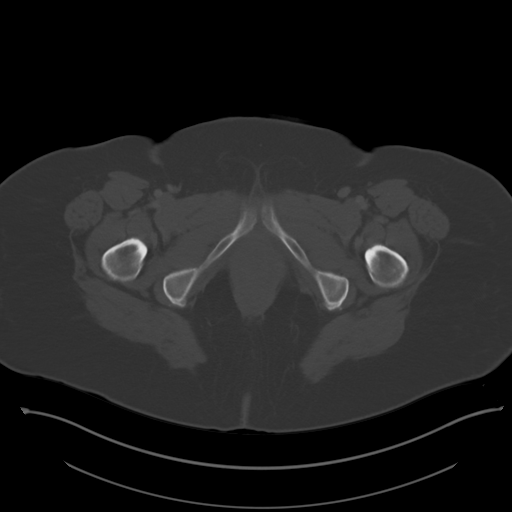
[im 17/97  soft-tissue]
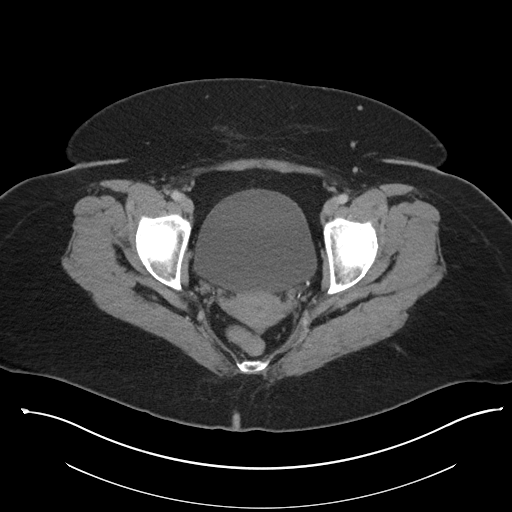
[im 22/97  soft-tissue]
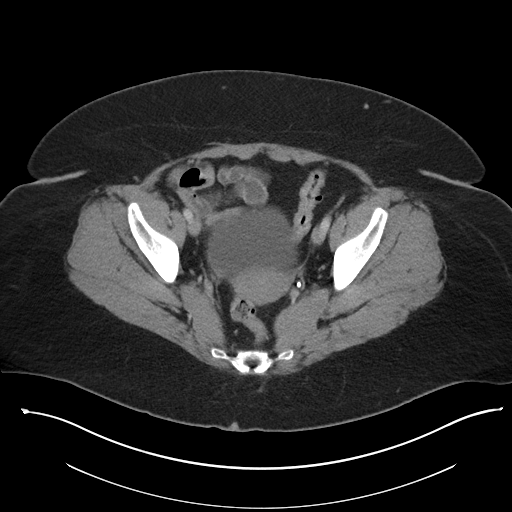
[im 27/97  soft-tissue]
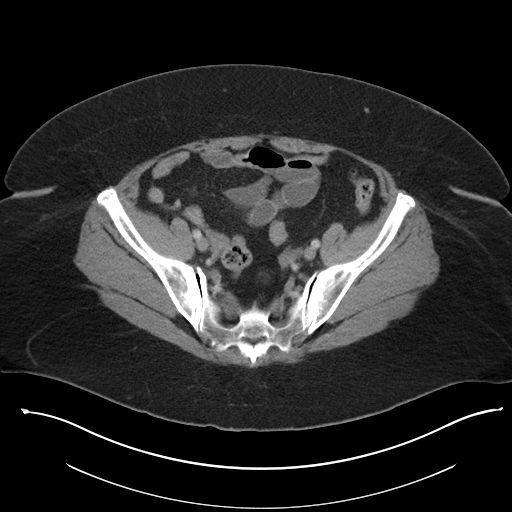
[im 38/97  soft-tissue]
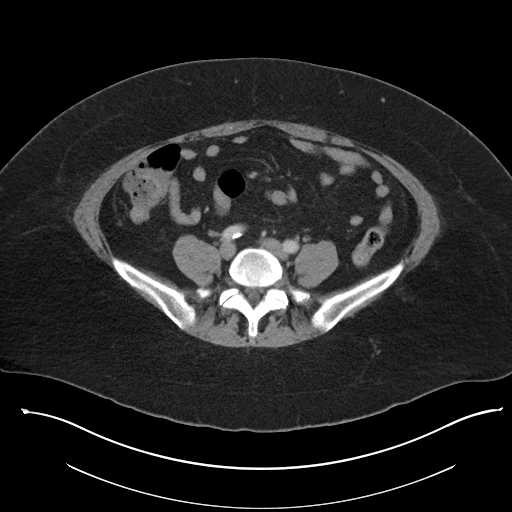
[im 43/97  soft-tissue]
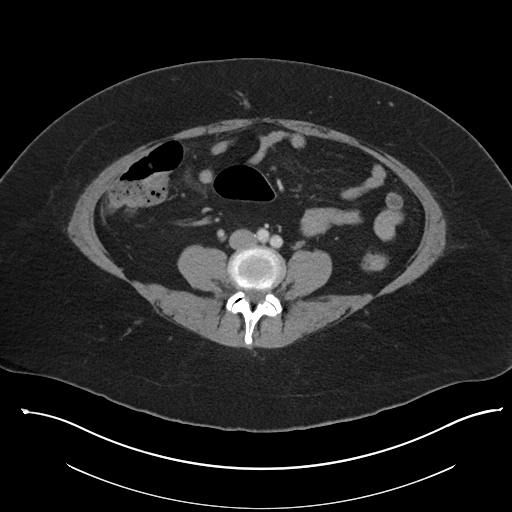
[im 54/97  soft-tissue]
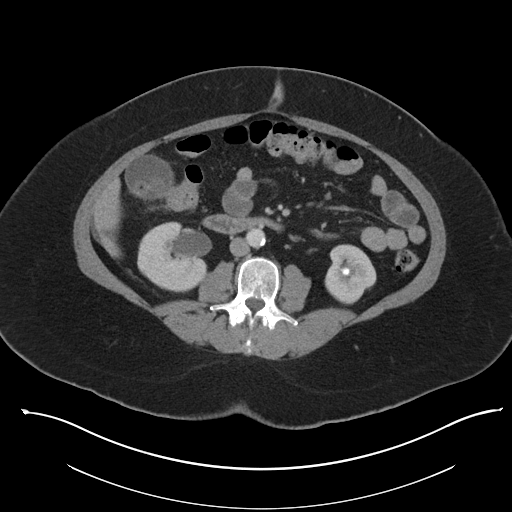
[im 59/97  soft-tissue]
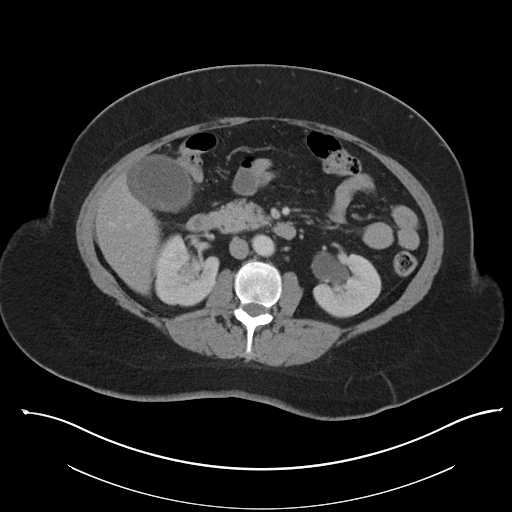
[im 70/97  soft-tissue]
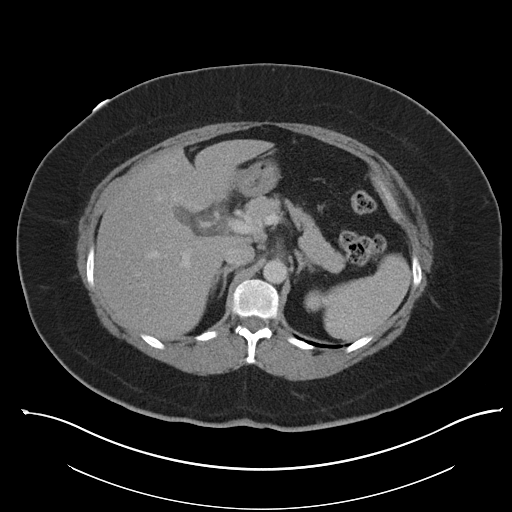
[im 70/97  bone]
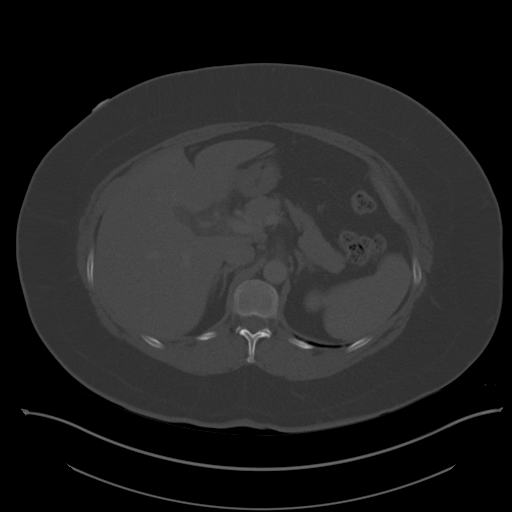
[im 75/97  soft-tissue]
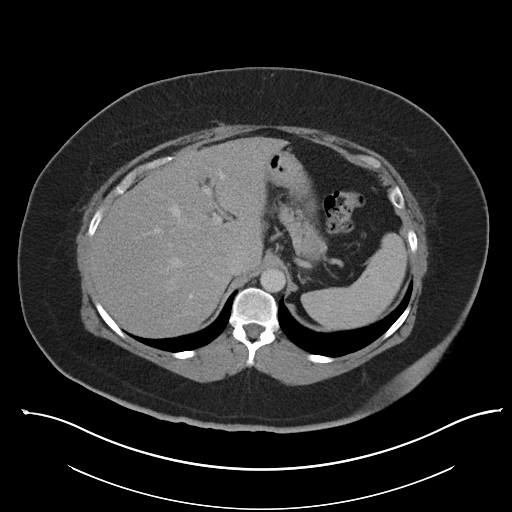
[im 81/97  soft-tissue]
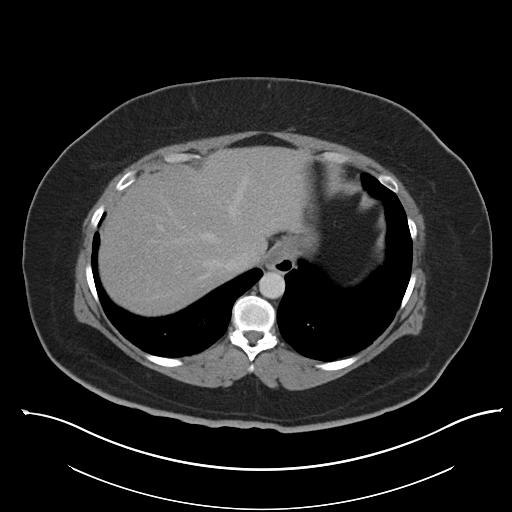
[im 91/97  soft-tissue]
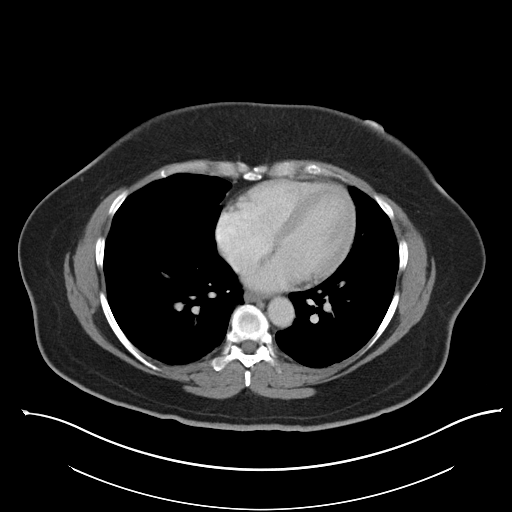

[Series 5: coronal soft tissue · coronal · 0.76mm/px · 3 of 101 slices shown]
[im 34/101  soft-tissue]
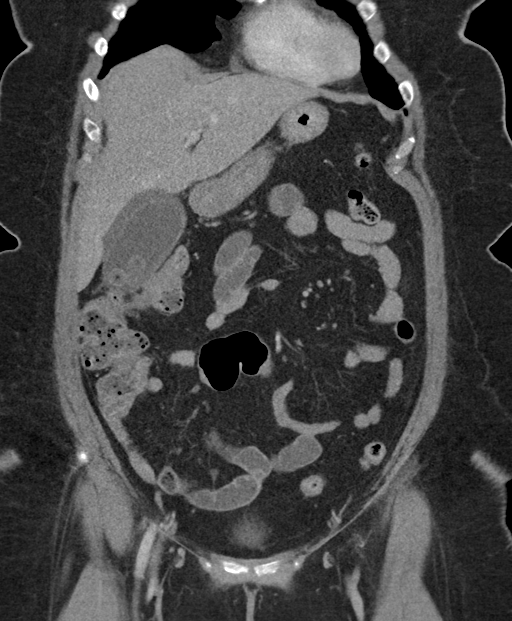
[im 45/101  soft-tissue]
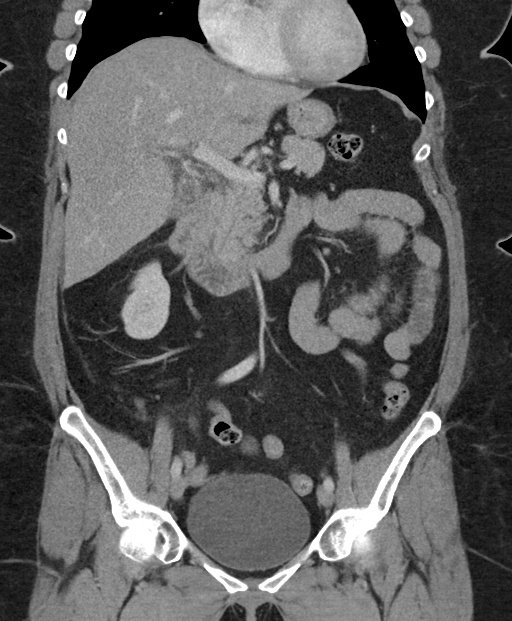
[im 56/101  soft-tissue]
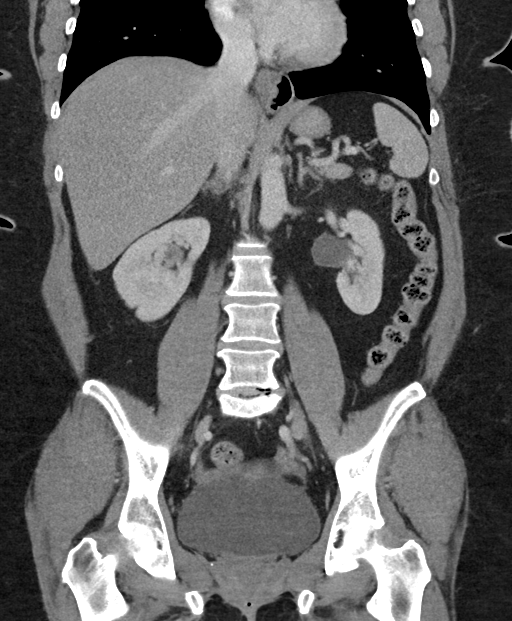

[15 of 46 positions shown; findings below may reference images not displayed]

FINDINGS: Lower chest: The visualized lung bases are grossly clear. The
visualized portions of the mediastinum are unremarkable.

Hepatobiliary: A 1.0 cm nonspecific hypodensity is noted at the
medial right hepatic lobe. Gallbladder wall thickening and trace
pericholecystic fluid are seen. Underlying stones are noted.
Findings raise concern for acute cholecystitis.

The common bile duct remains normal in caliber.

Pancreas: The pancreas is within normal limits.

Spleen: The spleen is unremarkable in appearance.

Adrenals/Urinary Tract: The adrenal glands are unremarkable in
appearance.

A 1.7 cm left renal stone is noted. Bilateral extrarenal pelves are
noted. There is no evidence of hydronephrosis. No ureteral stones
are identified. No perinephric stranding is seen.

Stomach/Bowel: The stomach is unremarkable in appearance. The small
bowel is within normal limits. The appendix is normal in caliber,
without evidence of appendicitis. The colon is unremarkable in
appearance.

Vascular/Lymphatic: Scattered calcification is seen along the
abdominal aorta and its branches. The abdominal aorta is otherwise
grossly unremarkable. The inferior vena cava is grossly
unremarkable. No retroperitoneal lymphadenopathy is seen. No pelvic
sidewall lymphadenopathy is identified.

Reproductive: The bladder is mildly distended and within normal
limits. The uterus is grossly unremarkable in appearance. The
ovaries are relatively symmetric. No suspicious adnexal masses are
seen.

Other: No additional soft tissue abnormalities are seen.

Musculoskeletal: No acute osseous abnormalities are identified. The
visualized musculature is unremarkable in appearance.
IMPRESSION: 1. Gallbladder wall thickening and trace pericholecystic fluid, with
underlying cholelithiasis. Findings raise concern for acute
cholecystitis.
2. 1.0 cm nonspecific hypodensity at the medial right hepatic lobe.
3. 1.7 cm nonobstructing left renal stone noted.
4. Scattered aortic atherosclerosis.

## 2017-06-04 IMAGING — RF DG CHOLANGIOGRAM OPERATIVE
1 series · 5 of 5 positions shown · non-contrast
Comparison: CT abdomen and pelvis - 04/27/2016

CLINICAL DATA: Cholecystitis.  Intraoperative cholangiogram.

EXAM:
INTRAOPERATIVE CHOLANGIOGRAM
FLUOROSCOPY TIME:  20 seconds

[Series 1: run · 2 acquisitions, 5 frames shown]
[im 1/2]
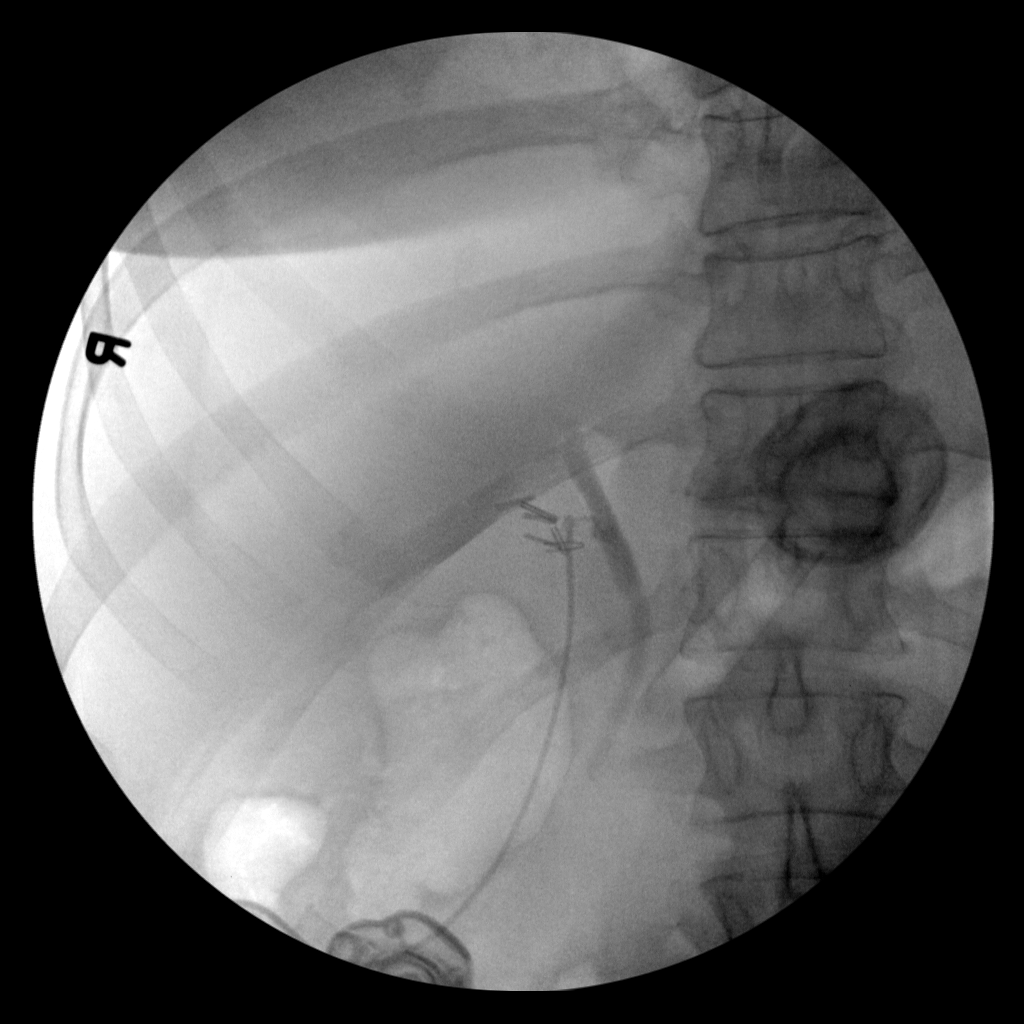
[im 1/2]
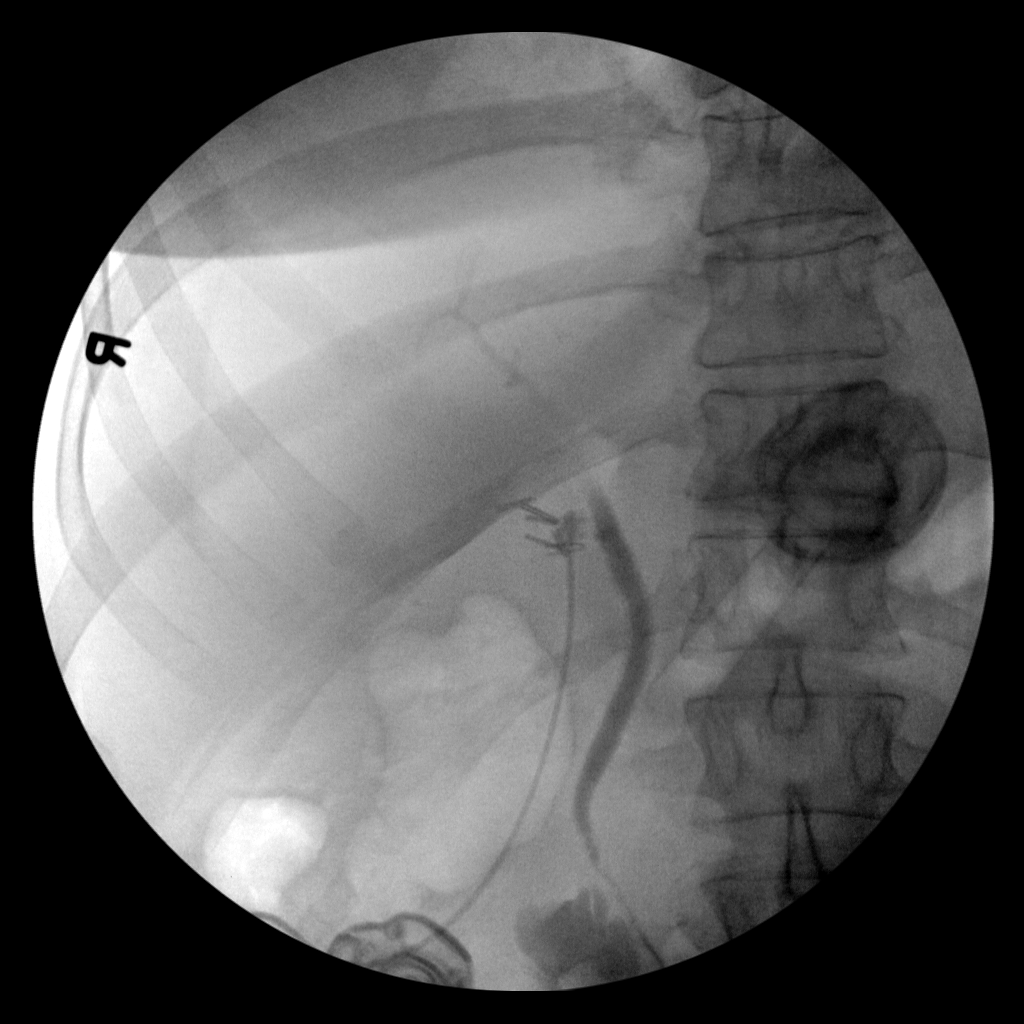
[im 1/2]
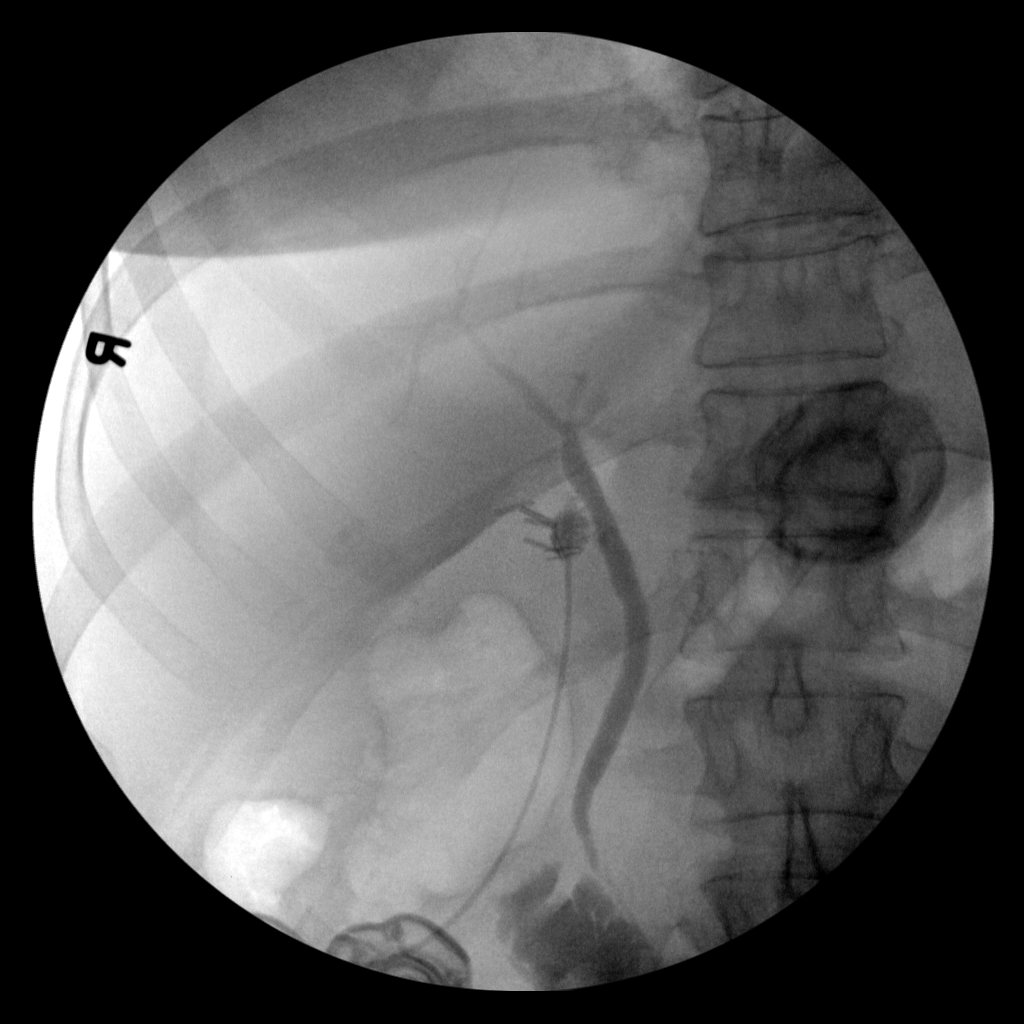
[im 1/2]
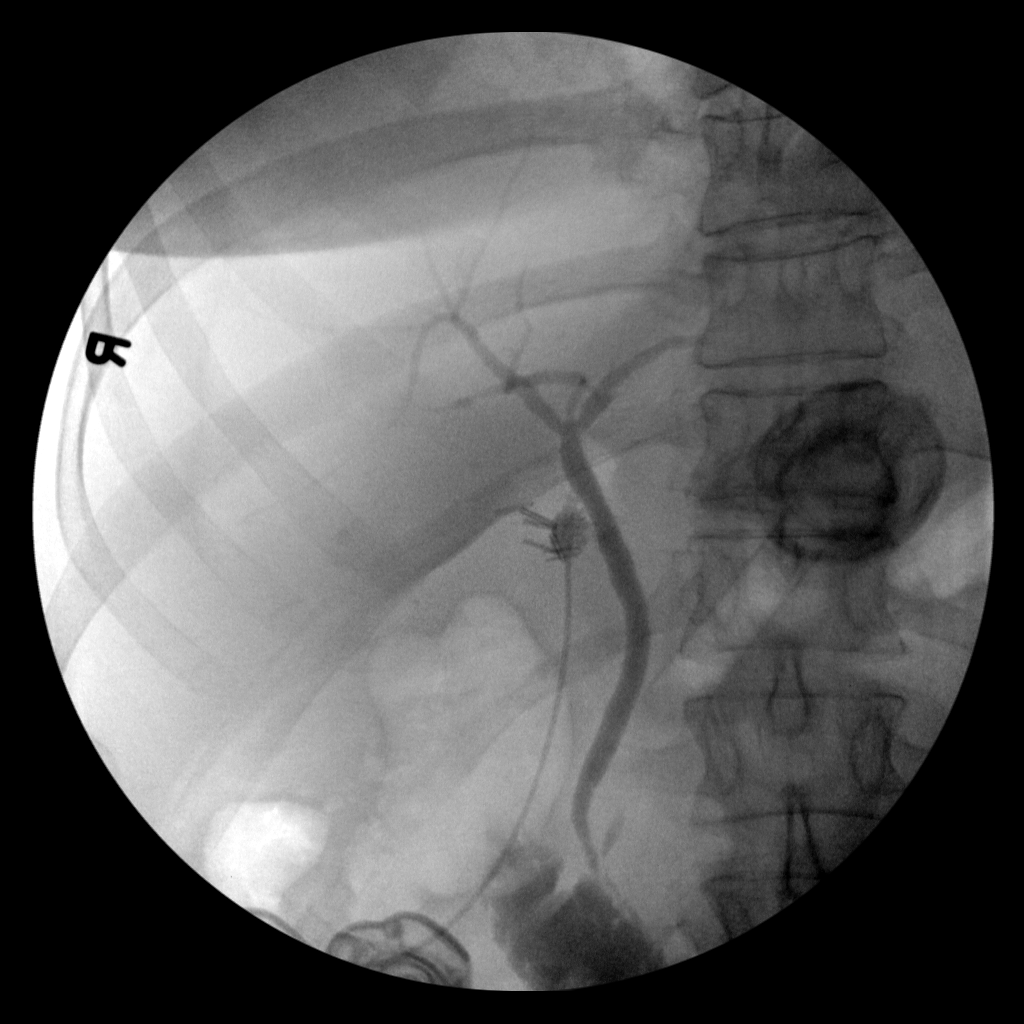
[im 2/2]
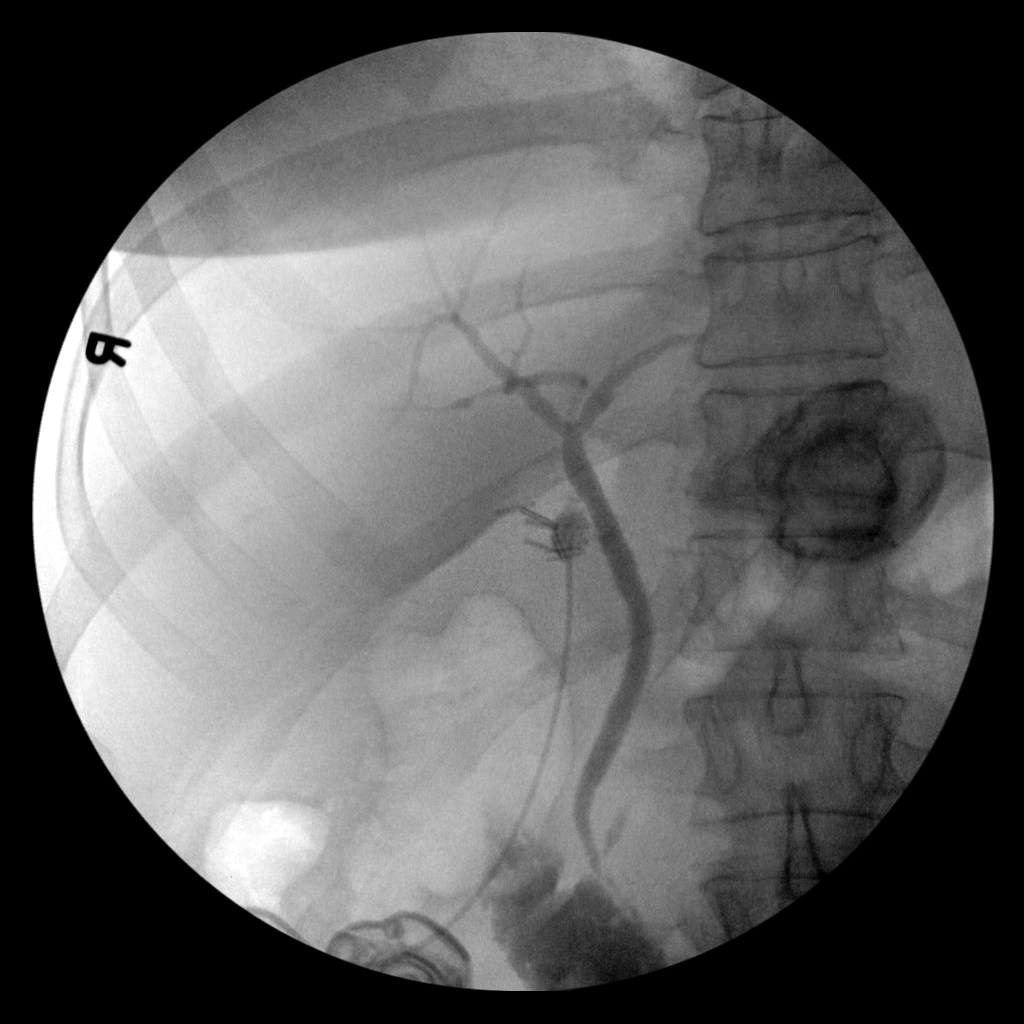

[5 of 5 positions shown; findings below may reference images not displayed]

FINDINGS: Intraoperative cholangiographic images of the right upper abdominal
quadrant during laparoscopic cholecystectomy are provided for
review.

Surgical clips overlie the expected location of the gallbladder
fossa.

Contrast injection demonstrates selective cannulation of the central
aspect of the cystic duct.

There is passage of contrast through the central aspect of the
cystic duct with filling of a non dilated common bile duct. There is
passage of contrast though the CBD and into the descending portion
of the duodenum.

There is minimal reflux of injected contrast into the common hepatic
duct and central aspect of the non dilated intrahepatic biliary
system.

There are no discrete filling defects within the opacified portions
of the biliary system to suggest the presence of
choledocholithiasis.
IMPRESSION: No evidence of choledocholithiasis.

## 2017-06-17 DIAGNOSIS — R42 Dizziness and giddiness: Secondary | ICD-10-CM

## 2017-06-17 DIAGNOSIS — R002 Palpitations: Secondary | ICD-10-CM | POA: Diagnosis not present

## 2017-06-17 DIAGNOSIS — R55 Syncope and collapse: Secondary | ICD-10-CM

## 2017-06-22 ENCOUNTER — Ambulatory Visit (INDEPENDENT_AMBULATORY_CARE_PROVIDER_SITE_OTHER): Payer: Commercial Managed Care - PPO | Admitting: Cardiology

## 2017-06-22 ENCOUNTER — Encounter: Payer: Self-pay | Admitting: Cardiology

## 2017-06-22 VITALS — BP 118/76 | HR 68 | Ht 63.0 in | Wt 200.0 lb

## 2017-06-22 DIAGNOSIS — I10 Essential (primary) hypertension: Secondary | ICD-10-CM

## 2017-06-22 DIAGNOSIS — I491 Atrial premature depolarization: Secondary | ICD-10-CM

## 2017-06-22 NOTE — Progress Notes (Signed)
Electrophysiology Office Note   Date:  06/22/2017   ID:  Dana Townsend, DOB 08/27/1960, MRN [id number]  PCP:  Shirline Frees, MD  Cardiologist:   Primary Electrophysiologist:  Dana Charlesworth Meredith Leeds, MD    Chief Complaint  Patient presents with  . Follow-up    Palpitations     History of Present Illness: Dana Townsend is a 57 y.o. female who is being seen today for the evaluation of presyncope at the request of Shirline Frees, MD. Presenting today for electrophysiology evaluation.  She has a history of diabetes, hyperlipidemia, vitamin D deficiency, and palpitations.  She possibly has SVT versus multifocal atrial tachycardia in the past as per chart review.  Today, she denies symptoms of chest pain, shortness of breath, orthopnea, PND, lower extremity edema, claudication, dizziness, presyncope, syncope, bleeding, or neurologic sequela. The patient is tolerating medications without difficulties.  She has been having palpitations for many years.  She takes metoprolol and this has helped to control her palpitations.  She is also had 3 episodes of near syncope since Thanksgiving.  She said that she had to sit down and was quite dizzy.  She noted both tachycardia and bradycardia at the time.  She says that she has had SVT, PVCs, and possibly atrial fibrillation in the past.  During the episode on Thanksgiving day, she checked her pulse radially which was difficult to palpate.  Today, denies symptoms of palpitations, chest pain, shortness of breath, orthopnea, PND, lower extremity edema, claudication, dizziness, presyncope, syncope, bleeding, or neurologic sequela. The patient is tolerating medications without difficulties.  She has not had palpitations since last being seen.  She wore a 30-day monitor that showed episodes of APCs as well as atrial tachycardia.  She felt palpitations during that time.  She has not taken her metoprolol in the last month and has had no major issue.  Past  Medical History:  Diagnosis Date  . Cancer ([place])   . Diabetes mellitus type II, controlled, with no complications ([place])   . Dyspepsia   . Dysrhythmia    runs SVT/VT   WITH ANXIETY     . Hypertension   . Kidney stones   . Menopausal symptoms   . Pure hypercholesterolemia   . Supraventricular tachycardia ([place])   . Vitamin D deficiency    Past Surgical History:  Procedure Laterality Date  . CESAREAN SECTION    . CHOLECYSTECTOMY N/A 04/28/2016   Procedure: LAPAROSCOPIC CHOLECYSTECTOMY WITH INTRAOPERATIVE CHOLANGIOGRAM;  Surgeon: Excell Seltzer, MD;  Location: [place];  Service: General;  Laterality: N/A;  . GANGLION CYST EXCISION    . HISTORY KIDNEY STONES    . KIDNEY STONE SURGERY     left and right  . LAPAROSCOPY    . MOHS SURGERY     FACE     Current Outpatient Medications  Medication Sig Dispense Refill  . Ascorbic Acid (VITAMIN C) 1000 MG tablet Take 1,000 mg by mouth daily.    . Calcium Carb-Cholecalciferol (CALCIUM 600+D3 PO) 1 tbs by mouth daily    . Cholecalciferol (VITAMIN D PO) Take 5,000 Units by mouth daily.    . Coenzyme Q10 (COQ10 PO) Take 1 tablet by mouth daily.     Marland Kitchen LORazepam (ATIVAN) 0.5 MG tablet Take 0.5 mg by mouth every 12 (twelve) hours as needed.  1  . MAGNESIUM PO Take 1 tablet by mouth daily.     . metoprolol tartrate (LOPRESSOR) 25 MG tablet Take 25 mg by mouth 2 (two) times  daily.    . Multiple Vitamin (MULTIVITAMIN) capsule Take 1 capsule by mouth daily.    . Multiple Vitamins-Minerals (EYE VITAMINS PO) Take by mouth.    . NON FORMULARY Take 1 capsule by mouth daily. NutriDyn    . NON FORMULARY Take 1 capsule by mouth daily. K2-7    . NON FORMULARY Take 1 capsule by mouth daily. Serrapetase    . NON FORMULARY Take 1 capsule by mouth 2 (two) times daily. DHA    . NONFORMULARY OR COMPOUNDED ITEM Take 1 capsule by mouth daily. Iodine Complex    . omega-3 acid ethyl esters (LOVAZA) 1 g capsule Take 1 g by mouth 2 (two) times daily.     . Probiotic  Product (PROBIOTIC PO) Take 1 tablet by mouth daily.     . Turmeric 500 MG TABS Take 1 capsule by mouth daily.    . vitamin B-12 (CYANOCOBALAMIN) 100 MCG tablet Take 100 mcg by mouth daily.    . vitamin E 1000 UNIT capsule Take 1,000 Units by mouth daily.     No current facility-administered medications for this visit.     Allergies:   Epinephrine; Erythromycin base; and Dilaudid [hydromorphone hcl]   Social History:  The patient  reports that  has never smoked. she has never used smokeless tobacco. She reports that she does not drink alcohol or use drugs.   Family History:  The patient's family history includes Adrenal disorder in her mother; Alzheimer's disease in her father; Hypertension in her father; Kidney cancer in her father.   ROS:  Please see the history of present illness.   Otherwise, review of systems is positive for none.   All other systems are reviewed and negative.   PHYSICAL EXAM: VS:  BP 118/76   Pulse 68   Ht 5\' 3"  (1.6 m)   Wt 200 lb (90.7 kg)   BMI 35.43 kg/m  , BMI Body mass index is 35.43 kg/m. GEN: Well nourished, well developed, in no acute distress  HEENT: normal  Neck: no JVD, carotid bruits, or masses Cardiac: RRR; no murmurs, rubs, or gallops,no edema  Respiratory:  clear to auscultation bilaterally, normal work of breathing GI: soft, nontender, nondistended, + BS MS: no deformity or atrophy  Skin: warm and dry Neuro:  Strength and sensation are intact Psych: euthymic mood, full affect  EKG:  EKG is not ordered today. Personal review of the ekg ordered 05/19/17 shows sinus rhythm, rate 58   Recent Labs: No results found for requested labs within last 8760 hours.    Lipid Panel  No results found for: CHOL, TRIG, HDL, CHOLHDL, VLDL, LDLCALC, LDLDIRECT   Wt Readings from Last 3 Encounters:  06/22/17 200 lb (90.7 kg)  05/19/17 200 lb (90.7 kg)  04/28/16 234 lb 5.6 oz (106.3 kg)      Other studies Reviewed: Additional studies/ records that  were reviewed today include: 30 day monitor 06/22/17 - personally reviewed Sinus rhythm Palpitations associated with SVT appearing atrial tachycardia  ASSESSMENT AND PLAN:  1.  PACs/atrial tachycardia: Found on her device interrogation.  She has had minimal symptoms since last being seen.  She is not taking her metoprolol in the last month.  She would like to stop this medication.  We Karey Suthers continue to follow without changes.  She Azyria Osmon call us back if she has further issues..    2.  Presyncope: No episodes that would cause presyncope on 30-day monitor.  Continue current management.  3.  Hypertension:  Currently well controlled.  No changes.  Current medicines are reviewed at length with the patient today.   The patient does not have concerns regarding her medicines.  The following changes were made today:  none  Labs/ tests ordered today include:  No orders of the defined types were placed in this encounter.    Disposition:   FU with Kabao Leite 1 year  Signed, Kevonna Nolte Meredith Leeds, MD  06/22/2017 11:50 AM     [facility] [address] [place] [address] [phone] (office) [phone] (fax)

## 2017-06-22 NOTE — Patient Instructions (Signed)
Medication Instructions:  Your physician has recommended you make the following change in your medication:  1. STOP Metoprolol  * If you need a refill on your cardiac medications before your next appointment, please call your pharmacy. *  Labwork: None ordered  Testing/Procedures: None ordered  Follow-Up: Your physician wants you to follow-up in: 1 year with Dr. Curt Bears.  You will receive a reminder letter in the mail two months in advance. If you don't receive a letter, please call our office to schedule the follow-up appointment.  Thank you for choosing [facility]!!   Trinidad Curet, RN [phone]

## 2018-05-15 ENCOUNTER — Other Ambulatory Visit: Payer: Self-pay | Admitting: Urology

## 2018-05-15 ENCOUNTER — Other Ambulatory Visit (HOSPITAL_COMMUNITY): Payer: Self-pay | Admitting: Urology

## 2018-05-15 DIAGNOSIS — N200 Calculus of kidney: Secondary | ICD-10-CM

## 2018-05-22 ENCOUNTER — Ambulatory Visit (HOSPITAL_COMMUNITY): Payer: Commercial Managed Care - PPO

## 2018-06-15 NOTE — Patient Instructions (Addendum)
SHELLY SPENSER  06/15/2018   Your procedure is scheduled on: 06-22-18    Report to [facility] Main  Entrance    Report to Interventional Radiology at 8:00 AM    Call this number if you have problems the morning of surgery [phone]    Remember: Do not eat food or drink liquids :After Midnight.    BRUSH YOUR TEETH MORNING OF SURGERY AND RINSE YOUR MOUTH OUT, NO CHEWING GUM CANDY OR MINTS.     Take these medicines the morning of surgery with A SIP OF WATER: None                                You may not have any metal on your body including hair pins and              piercings  Do not wear jewelry, make-up, lotions, powders or perfumes, deodorant             Do not wear nail polish.  Do not shave  48 hours prior to surgery.               Do not bring valuables to the hospital. [place].  Contacts, dentures or bridgework may not be worn into surgery.  Leave suitcase in the car. After surgery it may be brought to your room.     Patients discharged the day of surgery will not be allowed to drive home. IF YOU ARE HAVING SURGERY AND GOING HOME THE SAME DAY, YOU MUST HAVE AN ADULT TO DRIVE YOU HOME AND BE WITH YOU FOR 24 HOURS. YOU MAY GO HOME BY TAXI OR UBER OR ORTHERWISE, BUT AN ADULT MUST ACCOMPANY YOU HOME AND STAY WITH YOU FOR 24 HOURS.    Special Instructions: N/A              Please read over the following fact sheets you were given: _____________________________________________________________________             [facility] - Preparing for Surgery Before surgery, you can play an important role.  Because skin is not sterile, your skin needs to be as free of germs as possible.  You can reduce the number of germs on your skin by washing with CHG (chlorahexidine gluconate) soap before surgery.  CHG is an antiseptic cleaner which kills germs and bonds with the skin to continue killing germs even  after washing. Please DO NOT use if you have an allergy to CHG or antibacterial soaps.  If your skin becomes reddened/irritated stop using the CHG and inform your nurse when you arrive at Short Stay. Do not shave (including legs and underarms) for at least 48 hours prior to the first CHG shower.  You may shave your face/neck. Please follow these instructions carefully:  1.  Shower with CHG Soap the night before surgery and the  morning of Surgery.  2.  If you choose to wash your hair, wash your hair first as usual with your  normal  shampoo.  3.  After you shampoo, rinse your hair and body thoroughly to remove the  shampoo.  4.  Use CHG as you would any other liquid soap.  You can apply chg directly  to the skin and wash                       Gently with a scrungie or clean washcloth.  5.  Apply the CHG Soap to your body ONLY FROM THE NECK DOWN.   Do not use on face/ open                           Wound or open sores. Avoid contact with eyes, ears mouth and genitals (private parts).                       Wash face,  Genitals (private parts) with your normal soap.             6.  Wash thoroughly, paying special attention to the area where your surgery  will be performed.  7.  Thoroughly rinse your body with warm water from the neck down.  8.  DO NOT shower/wash with your normal soap after using and rinsing off  the CHG Soap.                9.  Pat yourself dry with a clean towel.            10.  Wear clean pajamas.            11.  Place clean sheets on your bed the night of your first shower and do not  sleep with pets. Day of Surgery : Do not apply any lotions/deodorants the morning of surgery.  Please wear clean clothes to the hospital/surgery center.  FAILURE TO FOLLOW THESE INSTRUCTIONS MAY RESULT IN THE CANCELLATION OF YOUR SURGERY PATIENT SIGNATURE_________________________________  NURSE  SIGNATURE__________________________________  ________________________________________________________________________

## 2018-06-19 ENCOUNTER — Encounter (HOSPITAL_COMMUNITY): Payer: Self-pay | Admitting: *Deleted

## 2018-06-19 ENCOUNTER — Other Ambulatory Visit: Payer: Self-pay

## 2018-06-19 ENCOUNTER — Encounter (HOSPITAL_COMMUNITY)
Admission: RE | Admit: 2018-06-19 | Discharge: 2018-06-19 | Disposition: A | Discharge status: Home / Self Care | Payer: Commercial Managed Care - PPO | Source: Ambulatory Visit | Attending: Urology | Admitting: Urology

## 2018-06-19 DIAGNOSIS — Z79899 Other long term (current) drug therapy: Secondary | ICD-10-CM | POA: Diagnosis not present

## 2018-06-19 DIAGNOSIS — I10 Essential (primary) hypertension: Secondary | ICD-10-CM | POA: Insufficient documentation

## 2018-06-19 DIAGNOSIS — Z01818 Encounter for other preprocedural examination: Secondary | ICD-10-CM | POA: Insufficient documentation

## 2018-06-19 DIAGNOSIS — N200 Calculus of kidney: Secondary | ICD-10-CM | POA: Diagnosis present

## 2018-06-19 DIAGNOSIS — E119 Type 2 diabetes mellitus without complications: Secondary | ICD-10-CM | POA: Diagnosis not present

## 2018-06-19 HISTORY — DX: Other complications of anesthesia, initial encounter: T88.59XA

## 2018-06-19 HISTORY — DX: Adverse effect of unspecified anesthetic, initial encounter: T41.45XA

## 2018-06-19 LAB — BASIC METABOLIC PANEL
Anion gap: 6 (ref 5–15)
BUN: 13 mg/dL (ref 6–20)
CALCIUM: 8.8 mg/dL — AB (ref 8.9–10.3)
CO2: 27 mmol/L (ref 22–32)
Chloride: 107 mmol/L (ref 98–111)
Creatinine, Ser: 0.72 mg/dL (ref 0.44–1.00)
GFR calc Af Amer: >60 mL/min (ref >60)
GFR calc non Af Amer: >60 mL/min (ref >60)
Glucose, Bld: 131 mg/dL — ABNORMAL HIGH (ref 70–99)
Potassium: 4 mmol/L (ref 3.5–5.1)
SODIUM: 140 mmol/L (ref 135–145)

## 2018-06-19 LAB — CBC
HCT: 46.6 % — ABNORMAL HIGH (ref 36.0–46.0)
Hemoglobin: 15.1 g/dL — ABNORMAL HIGH (ref 12.0–15.0)
MCH: 29.5 pg (ref 26.0–34.0)
MCHC: 32.4 g/dL (ref 30.0–36.0)
MCV: 91.2 fL (ref 80.0–100.0)
Platelets: 269 10*3/uL (ref 150–400)
RBC: 5.11 MIL/uL (ref 3.87–5.11)
RDW: 13.3 % (ref 11.5–15.5)
WBC: 6.7 10*3/uL (ref 4.0–10.5)
nRBC: 0 % (ref 0.0–0.2)

## 2018-06-20 ENCOUNTER — Other Ambulatory Visit: Payer: Self-pay | Admitting: Radiology

## 2018-06-20 LAB — HEMOGLOBIN A1C
Hgb A1c MFr Bld: 6.7 % — ABNORMAL HIGH (ref 4.8–5.6)
Mean Plasma Glucose: 146 mg/dL

## 2018-06-21 ENCOUNTER — Other Ambulatory Visit: Payer: Self-pay | Admitting: Radiology

## 2018-06-21 NOTE — Progress Notes (Signed)
Anesthesia Chart Review   Case:  [id number] Date/Time:  06/22/18 1130   Procedures:      NEPHROLITHOTOMY PERCUTANEOUS (Left ) - 3 HRS     HOLMIUM LASER APPLICATION (Left )   Anesthesia type:  General   Pre-op diagnosis:  LEFT RENAL STONE   Location:  [place] [facility]   Surgeon:  Franchot Gallo, MD      DISCUSSION:58 yo never smoker with h/o DM II, HTN, SVT/VT with anxiety, reports difficult to arouse with previous anesthesia, left renal stone scheduled for above procedure 06/22/18 with Dr. Franchot Gallo.   Pt seen by cardio electrophysiology, Dr. Reggy Eye, on 06/22/17 due to palpitations and presyncope.  30 day event monitor with sinus rhythm.  Palpitations associated with SVT appearing atrial tachycardia.  Asymptomatic at this visit with 1 year follow up recommended.    Pt can proceed with planned procedure barring acute status change.  VS: BP (!) 176/72   Pulse 66   Temp 37 C (Oral)   Resp 20   Ht 5\' 3"  (1.6 m)   Wt 103 kg   SpO2 100%   BMI 40.21 kg/m   PROVIDERS: Shirline Frees, MD is PCP   Reggy Eye, MD with Cardiology  LABS: Labs reviewed: Acceptable for surgery. (all labs ordered are listed, but only abnormal results are displayed)  Labs Reviewed  CBC - Abnormal; Notable for the following components:      Result Value   Hemoglobin 15.1 (*)    HCT 46.6 (*)    All other components within normal limits  BASIC METABOLIC PANEL - Abnormal; Notable for the following components:   Glucose, Bld 131 (*)    Calcium 8.8 (*)    All other components within normal limits  HEMOGLOBIN A1C - Abnormal; Notable for the following components:   Hgb A1c MFr Bld 6.7 (*)    All other components within normal limits  TYPE AND SCREEN     IMAGES:   EKG: 06/19/2018 Rate 64 bpm Normal sinus rhythm  Non-specific ST-t changes No significant change since last tracing   CV: Cardiac Event Monitor 05/19/17 Sinus rhythm Palpitations associated with SVT  appearing atrial tachycardia  Past Medical History:  Diagnosis Date  . Cancer ([place])   . Complication of anesthesia    Difficult to arouse  . Diabetes mellitus type II, controlled, with no complications ([place])   . Dyspepsia   . Dysrhythmia    runs SVT/VT   WITH ANXIETY     . Hypertension   . Kidney stones   . Menopausal symptoms   . Pure hypercholesterolemia   . Supraventricular tachycardia ([place])   . Vitamin D deficiency     Past Surgical History:  Procedure Laterality Date  . CESAREAN SECTION    . CHOLECYSTECTOMY N/A 04/28/2016   Procedure: LAPAROSCOPIC CHOLECYSTECTOMY WITH INTRAOPERATIVE CHOLANGIOGRAM;  Surgeon: Excell Seltzer, MD;  Location: [place];  Service: General;  Laterality: N/A;  . GANGLION CYST EXCISION    . HISTORY KIDNEY STONES    . KIDNEY STONE SURGERY     left and right  . LAPAROSCOPY    . MOHS SURGERY     FACE    MEDICATIONS: . Acetylcysteine (N-ACETYL-L-CYSTEINE) 600 MG CAPS  . b complex vitamins tablet  . Calcium Carb-Cholecalciferol (CALCIUM 600+D3 PO)  . Cholecalciferol (VITAMIN D-3) 125 MCG (5000 UT) TABS  . Coenzyme Q10 (COQ10 PO)  . Multiple Minerals-Vitamins (CAL MAG ZINC +D3 PO)  . Multiple Vitamin (MULTIVITAMIN WITH  MINERALS) TABS tablet  . Multiple Vitamins-Minerals (LUTEIN-ZEAXANTHIN PO)  . NON FORMULARY  . Omega-3 Fatty Acids (OMEGA 3 PO)  . Probiotic Product (PROBIOTIC PO)  . TURMERIC PO  . vitamin C (ASCORBIC ACID) 500 MG tablet  . vitamin E 400 UNIT capsule   No current facility-administered medications for this encounter.      Maia Plan [facility] Pre-Surgical Testing [phone] 06/21/18 11:28 AM

## 2018-06-21 NOTE — Anesthesia Preprocedure Evaluation (Addendum)
Anesthesia Evaluation  Patient identified by MRN, date of birth, ID band Patient awake    Reviewed: Allergy & Precautions, H&P , NPO status , Patient's Chart, lab work & pertinent test results, reviewed documented beta blocker date and time   History of Anesthesia Complications (+) PROLONGED EMERGENCE and history of anesthetic complications  Airway Mallampati: II  TM Distance: >3 FB Neck ROM: full    Dental no notable dental hx.    Pulmonary neg pulmonary ROS,    Pulmonary exam normal breath sounds clear to auscultation       Cardiovascular Exercise Tolerance: Good hypertension, + dysrhythmias Supra Ventricular Tachycardia  Rhythm:regular Rate:Normal  Pt seen by cardio electrophysiology, Dr. Reggy Eye, on 06/22/17 due to palpitations and presyncope.  30 day event monitor with sinus rhythm.  Palpitations associated with SVT appearing atrial tachycardia.  Asymptomatic at this visit with 1 year follow up recommended.     Neuro/Psych negative neurological ROS  negative psych ROS   GI/Hepatic negative GI ROS, Neg liver ROS,   Endo/Other  diabetes, Type 2  Renal/GU negative Renal ROS  negative genitourinary   Musculoskeletal   Abdominal   Peds  Hematology negative hematology ROS (+)   Anesthesia Other Findings 58 yo never smoker with h/o DM II, HTN, SVT/VT with anxiety, reports difficult to arouse with previous anesthesia, left renal stone scheduled for above procedure 06/22/18 with Dr. Franchot Gallo.    Reproductive/Obstetrics negative OB ROS                           Anesthesia Physical Anesthesia Plan  ASA: III  Anesthesia Plan: General   Post-op Pain Management:    Induction: Intravenous  PONV Risk Score and Plan: 3 and Ondansetron, Dexamethasone and Treatment may vary due to age or medical condition  Airway Management Planned: LMA and Oral ETT  Additional Equipment:    Intra-op Plan:   Post-operative Plan:   Informed Consent: I have reviewed the patients History and Physical, chart, labs and discussed the procedure including the risks, benefits and alternatives for the proposed anesthesia with the patient or authorized representative who has indicated his/her understanding and acceptance.     Dental Advisory Given  Plan Discussed with: CRNA, Anesthesiologist and Surgeon  Anesthesia Plan Comments: (See PAT note 06/19/2018, Konrad Felix, PA-C)      Anesthesia Quick Evaluation

## 2018-06-22 ENCOUNTER — Encounter (HOSPITAL_COMMUNITY): Payer: Self-pay | Admitting: *Deleted

## 2018-06-22 ENCOUNTER — Encounter (HOSPITAL_COMMUNITY)
Admission: RE | Disposition: A | Discharge status: Home / Self Care | Payer: Self-pay | Source: Ambulatory Visit | Attending: Urology

## 2018-06-22 ENCOUNTER — Ambulatory Visit (HOSPITAL_COMMUNITY)
Admission: RE | Admit: 2018-06-22 | Discharge: 2018-06-22 | Disposition: A | Discharge status: Home / Self Care | Payer: Commercial Managed Care - PPO | Source: Ambulatory Visit | Attending: Urology | Admitting: Urology

## 2018-06-22 ENCOUNTER — Other Ambulatory Visit: Payer: Self-pay

## 2018-06-22 ENCOUNTER — Ambulatory Visit (HOSPITAL_COMMUNITY): Payer: Commercial Managed Care - PPO | Admitting: Anesthesiology

## 2018-06-22 ENCOUNTER — Observation Stay (HOSPITAL_COMMUNITY)
Admission: RE | Admit: 2018-06-22 | Discharge: 2018-06-23 | Disposition: A | Discharge status: Home / Self Care | Payer: Commercial Managed Care - PPO | Source: Ambulatory Visit | Attending: Urology | Admitting: Urology

## 2018-06-22 ENCOUNTER — Ambulatory Visit (HOSPITAL_COMMUNITY): Payer: Commercial Managed Care - PPO | Admitting: Physician Assistant

## 2018-06-22 ENCOUNTER — Ambulatory Visit (HOSPITAL_COMMUNITY): Payer: Commercial Managed Care - PPO

## 2018-06-22 DIAGNOSIS — J45909 Unspecified asthma, uncomplicated: Secondary | ICD-10-CM | POA: Insufficient documentation

## 2018-06-22 DIAGNOSIS — C9000 Multiple myeloma not having achieved remission: Secondary | ICD-10-CM

## 2018-06-22 DIAGNOSIS — D571 Sickle-cell disease without crisis: Secondary | ICD-10-CM | POA: Insufficient documentation

## 2018-06-22 DIAGNOSIS — Z79899 Other long term (current) drug therapy: Secondary | ICD-10-CM | POA: Insufficient documentation

## 2018-06-22 DIAGNOSIS — E119 Type 2 diabetes mellitus without complications: Secondary | ICD-10-CM | POA: Insufficient documentation

## 2018-06-22 DIAGNOSIS — I110 Hypertensive heart disease with heart failure: Secondary | ICD-10-CM | POA: Insufficient documentation

## 2018-06-22 DIAGNOSIS — N200 Calculus of kidney: Secondary | ICD-10-CM | POA: Insufficient documentation

## 2018-06-22 DIAGNOSIS — I471 Supraventricular tachycardia: Secondary | ICD-10-CM | POA: Insufficient documentation

## 2018-06-22 DIAGNOSIS — E559 Vitamin D deficiency, unspecified: Secondary | ICD-10-CM

## 2018-06-22 DIAGNOSIS — I509 Heart failure, unspecified: Secondary | ICD-10-CM

## 2018-06-22 DIAGNOSIS — E7800 Pure hypercholesterolemia, unspecified: Secondary | ICD-10-CM | POA: Insufficient documentation

## 2018-06-22 DIAGNOSIS — I10 Essential (primary) hypertension: Secondary | ICD-10-CM | POA: Insufficient documentation

## 2018-06-22 HISTORY — PX: NEPHROLITHOTOMY: SHX5134

## 2018-06-22 HISTORY — PX: IR URETERAL STENT PLACEMENT EXISTING ACCESS LEFT: IMG6073

## 2018-06-22 LAB — CBC WITH DIFFERENTIAL/PLATELET
Abs Immature Granulocytes: 0.02 10*3/uL (ref 0.00–0.07)
Basophils Absolute: 0 10*3/uL (ref 0.0–0.1)
Basophils Relative: 1 %
Eosinophils Absolute: 0.3 10*3/uL (ref 0.0–0.5)
Eosinophils Relative: 4 %
HCT: 46 % (ref 36.0–46.0)
HEMOGLOBIN: 14.9 g/dL (ref 12.0–15.0)
Immature Granulocytes: 0 %
LYMPHS PCT: 31 %
Lymphs Abs: 2.1 10*3/uL (ref 0.7–4.0)
MCH: 29.4 pg (ref 26.0–34.0)
MCHC: 32.4 g/dL (ref 30.0–36.0)
MCV: 90.7 fL (ref 80.0–100.0)
Monocytes Absolute: 0.7 10*3/uL (ref 0.1–1.0)
Monocytes Relative: 10 %
NEUTROS ABS: 3.7 10*3/uL (ref 1.7–7.7)
Neutrophils Relative %: 54 %
Platelets: 268 10*3/uL (ref 150–400)
RBC: 5.07 MIL/uL (ref 3.87–5.11)
RDW: 13.3 % (ref 11.5–15.5)
WBC: 6.8 10*3/uL (ref 4.0–10.5)
nRBC: 0 % (ref 0.0–0.2)

## 2018-06-22 LAB — URINALYSIS, ROUTINE W REFLEX MICROSCOPIC
Bacteria, UA: NONE SEEN
Bilirubin Urine: NEGATIVE
Glucose, UA: NEGATIVE mg/dL
Ketones, ur: NEGATIVE mg/dL
LEUKOCYTE UA: NEGATIVE
Nitrite: NEGATIVE
Protein, ur: 30 mg/dL — AB
RBC / HPF: >50 RBC/hpf — ABNORMAL HIGH (ref 0–5)
Specific Gravity, Urine: 1.027 (ref 1.005–1.030)
pH: 7 (ref 5.0–8.0)

## 2018-06-22 LAB — GLUCOSE, CAPILLARY
GLUCOSE-CAPILLARY: 229 mg/dL — AB (ref 70–99)
Glucose-Capillary: 148 mg/dL — ABNORMAL HIGH (ref 70–99)
Glucose-Capillary: 219 mg/dL — ABNORMAL HIGH (ref 70–99)

## 2018-06-22 LAB — TYPE AND SCREEN
ABO/RH(D): O POS
ANTIBODY SCREEN: NEGATIVE

## 2018-06-22 LAB — PROTIME-INR
INR: 0.95
Prothrombin Time: 12.6 seconds (ref 11.4–15.2)

## 2018-06-22 SURGERY — NEPHROLITHOTOMY PERCUTANEOUS
Anesthesia: General | Laterality: Left

## 2018-06-22 MED ORDER — ACETAMINOPHEN 160 MG/5ML PO SOLN: 325.0000 mg | ORAL | Status: DC | PRN

## 2018-06-22 MED ORDER — FENTANYL CITRATE (PF) 250 MCG/5ML IJ SOLN
INTRAMUSCULAR | Status: AC
  Filled 2018-06-22: qty 5

## 2018-06-22 MED ORDER — CEPHALEXIN 500 MG PO CAPS
500.0000 mg | ORAL_CAPSULE | Freq: Two times a day (BID) | ORAL | Status: DC
  Administered 2018-06-22 – 2018-06-23 (×2): 500 mg via ORAL
  Filled 2018-06-22 (×2): qty 1

## 2018-06-22 MED ORDER — LIDOCAINE 2% (20 MG/ML) 5 ML SYRINGE
INTRAMUSCULAR | Status: DC | PRN
  Administered 2018-06-22: 100 mg via INTRAVENOUS

## 2018-06-22 MED ORDER — CEPHALEXIN 500 MG PO CAPS: 500.0000 mg | ORAL_CAPSULE | Freq: Two times a day (BID) | ORAL | 0 refills | Status: DC

## 2018-06-22 MED ORDER — LIDOCAINE 2% (20 MG/ML) 5 ML SYRINGE
INTRAMUSCULAR | Status: AC
  Filled 2018-06-22: qty 5

## 2018-06-22 MED ORDER — OXYCODONE HCL 5 MG PO TABS: 5.0000 mg | ORAL_TABLET | Freq: Once | ORAL | Status: DC | PRN

## 2018-06-22 MED ORDER — ACETAMINOPHEN 325 MG PO TABS: 650.0000 mg | ORAL_TABLET | ORAL | Status: DC | PRN

## 2018-06-22 MED ORDER — DEXAMETHASONE SODIUM PHOSPHATE 10 MG/ML IJ SOLN
INTRAMUSCULAR | Status: AC
  Filled 2018-06-22: qty 1

## 2018-06-22 MED ORDER — ACETAMINOPHEN 325 MG PO TABS: 325.0000 mg | ORAL_TABLET | ORAL | Status: DC | PRN

## 2018-06-22 MED ORDER — ONDANSETRON HCL 4 MG/2ML IJ SOLN
INTRAMUSCULAR | Status: AC
  Filled 2018-06-22: qty 2

## 2018-06-22 MED ORDER — SENNA 8.6 MG PO TABS
1.0000 | ORAL_TABLET | Freq: Two times a day (BID) | ORAL | Status: DC
  Administered 2018-06-22 – 2018-06-23 (×2): 8.6 mg via ORAL
  Filled 2018-06-22 (×3): qty 1

## 2018-06-22 MED ORDER — PROPOFOL 10 MG/ML IV BOLUS
INTRAVENOUS | Status: DC | PRN
  Administered 2018-06-22: 180 mg via INTRAVENOUS

## 2018-06-22 MED ORDER — OXYBUTYNIN CHLORIDE 5 MG PO TABS
5.0000 mg | ORAL_TABLET | Freq: Three times a day (TID) | ORAL | Status: DC | PRN
  Administered 2018-06-22 – 2018-06-23 (×3): 5 mg via ORAL
  Filled 2018-06-22 (×3): qty 1

## 2018-06-22 MED ORDER — ACETAMINOPHEN 10 MG/ML IV SOLN
1000.0000 mg | Freq: Four times a day (QID) | INTRAVENOUS | Status: DC
  Administered 2018-06-22 – 2018-06-23 (×3): 1000 mg via INTRAVENOUS
  Filled 2018-06-22 (×4): qty 100

## 2018-06-22 MED ORDER — SUGAMMADEX SODIUM 200 MG/2ML IV SOLN
INTRAVENOUS | Status: DC | PRN
  Administered 2018-06-22: 300 mg via INTRAVENOUS

## 2018-06-22 MED ORDER — OXYCODONE HCL 5 MG PO TABS: 5.0000 mg | ORAL_TABLET | Freq: Four times a day (QID) | ORAL | 0 refills | Status: DC | PRN

## 2018-06-22 MED ORDER — LIDOCAINE HCL 1 % IJ SOLN
INTRAMUSCULAR | Status: AC
  Filled 2018-06-22: qty 20

## 2018-06-22 MED ORDER — LIDOCAINE HCL (PF) 1 % IJ SOLN
INTRAMUSCULAR | Status: AC | PRN
  Administered 2018-06-22: 10 mL

## 2018-06-22 MED ORDER — FENTANYL CITRATE (PF) 100 MCG/2ML IJ SOLN
INTRAMUSCULAR | Status: AC
  Filled 2018-06-22: qty 2

## 2018-06-22 MED ORDER — ONDANSETRON HCL 4 MG/2ML IJ SOLN: 4.0000 mg | INTRAMUSCULAR | Status: DC | PRN

## 2018-06-22 MED ORDER — MIDAZOLAM HCL 2 MG/2ML IJ SOLN
INTRAMUSCULAR | Status: AC | PRN
  Administered 2018-06-22 (×4): 1 mg via INTRAVENOUS

## 2018-06-22 MED ORDER — SODIUM CHLORIDE 0.9 % IR SOLN
Status: DC | PRN
  Administered 2018-06-22: 12000 mL

## 2018-06-22 MED ORDER — SUGAMMADEX SODIUM 500 MG/5ML IV SOLN
INTRAVENOUS | Status: AC
  Filled 2018-06-22: qty 5

## 2018-06-22 MED ORDER — MIDAZOLAM HCL 2 MG/2ML IJ SOLN
INTRAMUSCULAR | Status: AC
  Filled 2018-06-22: qty 4

## 2018-06-22 MED ORDER — FENTANYL CITRATE (PF) 100 MCG/2ML IJ SOLN
25.0000 ug | INTRAMUSCULAR | Status: DC | PRN
  Administered 2018-06-22 (×2): 50 ug via INTRAVENOUS

## 2018-06-22 MED ORDER — ROCURONIUM BROMIDE 100 MG/10ML IV SOLN
INTRAVENOUS | Status: AC
  Filled 2018-06-22: qty 1

## 2018-06-22 MED ORDER — IOPAMIDOL (ISOVUE-300) INJECTION 61%
50.0000 mL | Freq: Once | INTRAVENOUS | Status: AC | PRN
  Administered 2018-06-22: 20 mL

## 2018-06-22 MED ORDER — ALBUMIN HUMAN 5 % IV SOLN
INTRAVENOUS | Status: AC
  Filled 2018-06-22: qty 250

## 2018-06-22 MED ORDER — OXYCODONE HCL 5 MG PO TABS
5.0000 mg | ORAL_TABLET | ORAL | Status: DC | PRN
  Administered 2018-06-22 – 2018-06-23 (×4): 5 mg via ORAL
  Filled 2018-06-22 (×4): qty 1

## 2018-06-22 MED ORDER — PROPOFOL 10 MG/ML IV BOLUS
INTRAVENOUS | Status: AC
  Filled 2018-06-22: qty 20

## 2018-06-22 MED ORDER — MORPHINE SULFATE (PF) 2 MG/ML IV SOLN
2.0000 mg | INTRAVENOUS | Status: DC | PRN
  Administered 2018-06-22 (×3): 2 mg via INTRAVENOUS
  Filled 2018-06-22: qty 1
  Filled 2018-06-22: qty 2
  Filled 2018-06-22: qty 1

## 2018-06-22 MED ORDER — ROCURONIUM BROMIDE 100 MG/10ML IV SOLN
INTRAVENOUS | Status: DC | PRN
  Administered 2018-06-22: 10 mg via INTRAVENOUS
  Administered 2018-06-22: 50 mg via INTRAVENOUS

## 2018-06-22 MED ORDER — IOPAMIDOL (ISOVUE-300) INJECTION 61%
INTRAVENOUS | Status: AC
  Administered 2018-06-22: 20 mL
  Filled 2018-06-22: qty 50

## 2018-06-22 MED ORDER — CEFAZOLIN SODIUM-DEXTROSE 2-4 GM/100ML-% IV SOLN
INTRAVENOUS | Status: AC
  Administered 2018-06-22: 2000 mg
  Filled 2018-06-22: qty 100

## 2018-06-22 MED ORDER — SODIUM CHLORIDE 0.45 % IV SOLN
INTRAVENOUS | Status: DC
  Administered 2018-06-22: 15:00:00 via INTRAVENOUS

## 2018-06-22 MED ORDER — ONDANSETRON HCL 4 MG/2ML IJ SOLN: 4.0000 mg | Freq: Once | INTRAMUSCULAR | Status: DC | PRN

## 2018-06-22 MED ORDER — DEXAMETHASONE SODIUM PHOSPHATE 10 MG/ML IJ SOLN
INTRAMUSCULAR | Status: DC | PRN
  Administered 2018-06-22: 10 mg via INTRAVENOUS

## 2018-06-22 MED ORDER — OXYCODONE HCL 5 MG/5ML PO SOLN: 5.0000 mg | Freq: Once | ORAL | Status: DC | PRN

## 2018-06-22 MED ORDER — ZOLPIDEM TARTRATE 5 MG PO TABS: 5.0000 mg | ORAL_TABLET | Freq: Every evening | ORAL | Status: DC | PRN

## 2018-06-22 MED ORDER — FENTANYL CITRATE (PF) 100 MCG/2ML IJ SOLN
INTRAMUSCULAR | Status: AC | PRN
  Administered 2018-06-22 (×2): 50 ug via INTRAVENOUS

## 2018-06-22 MED ORDER — LACTATED RINGERS IV SOLN
INTRAVENOUS | Status: DC
  Administered 2018-06-22 (×2): via INTRAVENOUS

## 2018-06-22 MED ORDER — CEFAZOLIN SODIUM-DEXTROSE 2-4 GM/100ML-% IV SOLN: 2.0000 g | INTRAVENOUS | Status: DC

## 2018-06-22 MED ORDER — ONDANSETRON HCL 4 MG/2ML IJ SOLN
INTRAMUSCULAR | Status: DC | PRN
  Administered 2018-06-22: 4 mg via INTRAVENOUS

## 2018-06-22 MED ORDER — SODIUM CHLORIDE 0.9 % IV SOLN: INTRAVENOUS | Status: DC

## 2018-06-22 MED ORDER — FENTANYL CITRATE (PF) 250 MCG/5ML IJ SOLN
INTRAMUSCULAR | Status: DC | PRN
  Administered 2018-06-22: 100 ug via INTRAVENOUS
  Administered 2018-06-22 (×3): 50 ug via INTRAVENOUS

## 2018-06-22 MED ORDER — IOHEXOL 300 MG/ML  SOLN
INTRAMUSCULAR | Status: DC | PRN
  Administered 2018-06-22: 20 mL

## 2018-06-22 MED ORDER — MEPERIDINE HCL 50 MG/ML IJ SOLN: 6.2500 mg | INTRAMUSCULAR | Status: DC | PRN

## 2018-06-22 SURGICAL SUPPLY — 54 items
AGENT HMST KT MTR STRL THRMB (HEMOSTASIS) ×1
APL ESCP 34 STRL LF DISP (HEMOSTASIS) ×1
APL SKNCLS STERI-STRIP NONHPOA (GAUZE/BANDAGES/DRESSINGS) ×1
APPLICATOR SURGIFLO ENDO (HEMOSTASIS) ×2 IMPLANT
BAG URINE DRAINAGE (UROLOGICAL SUPPLIES) ×2 IMPLANT
BASKET ZERO TIP NITINOL 2.4FR (BASKET) ×1 IMPLANT
BENZOIN TINCTURE PRP APPL 2/3 (GAUZE/BANDAGES/DRESSINGS) ×4 IMPLANT
BLADE SURG 15 STRL LF DISP TIS (BLADE) ×1 IMPLANT
BLADE SURG 15 STRL SS (BLADE) ×3
BSKT STON RTRVL ZERO TP 2.4FR (BASKET)
CATCHER STONE W/TUBE ADAPTER (UROLOGICAL SUPPLIES) IMPLANT
CATH DILATION NEPHR BALL 15X10 (CATHETERS) ×2 IMPLANT
CATH FOLEY 2W COUNCIL 20FR 5CC (CATHETERS) IMPLANT
CATH ROBINSON RED A/P 20FR (CATHETERS) IMPLANT
CATH X-FORCE N30 NEPHROSTOMY (TUBING) ×3 IMPLANT
COVER SURGICAL LIGHT HANDLE (MISCELLANEOUS) ×3 IMPLANT
COVER WAND RF STERILE (DRAPES) IMPLANT
DRAPE C-ARM 42X120 X-RAY (DRAPES) ×5 IMPLANT
DRAPE LINGEMAN PERC (DRAPES) ×3 IMPLANT
DRAPE SURG IRRIG POUCH 19X23 (DRAPES) ×3 IMPLANT
DRSG PAD ABDOMINAL 8X10 ST (GAUZE/BANDAGES/DRESSINGS) IMPLANT
DRSG TEGADERM 8X12 (GAUZE/BANDAGES/DRESSINGS) ×6 IMPLANT
EXTRACTOR STONE 1.7FRX115CM (UROLOGICAL SUPPLIES) ×1 IMPLANT
FIBER LASER FLEXIVA 1000 (UROLOGICAL SUPPLIES) IMPLANT
FIBER LASER FLEXIVA 365 (UROLOGICAL SUPPLIES) IMPLANT
FIBER LASER FLEXIVA 550 (UROLOGICAL SUPPLIES) IMPLANT
FIBER LASER TRAC TIP (UROLOGICAL SUPPLIES) IMPLANT
GAUZE SPONGE 4X4 12PLY STRL (GAUZE/BANDAGES/DRESSINGS) IMPLANT
GLOVE BIOGEL M 8.0 STRL (GLOVE) ×9 IMPLANT
GOWN STRL REUS W/TWL XL LVL3 (GOWN DISPOSABLE) ×5 IMPLANT
GUIDEWIRE AMPLAZ .035X145 (WIRE) ×6 IMPLANT
KIT BASIN OR (CUSTOM PROCEDURE TRAY) ×3 IMPLANT
KIT PROBE TRILOGY 3.9X350 (MISCELLANEOUS) ×2 IMPLANT
MANIFOLD NEPTUNE II (INSTRUMENTS) ×3 IMPLANT
NS IRRIG 1000ML POUR BTL (IV SOLUTION) ×3 IMPLANT
PACK CYSTO (CUSTOM PROCEDURE TRAY) ×3 IMPLANT
PROBE LITHOCLAST ULTRA 3.8X403 (UROLOGICAL SUPPLIES) IMPLANT
PROBE PNEUMATIC 1.0MMX570MM (UROLOGICAL SUPPLIES) IMPLANT
SET IRRIG Y TYPE TUR BLADDER L (SET/KITS/TRAYS/PACK) IMPLANT
SHEATH PEELAWAY SET 9 (SHEATH) ×3 IMPLANT
SPONGE LAP 4X18 RFD (DISPOSABLE) ×3 IMPLANT
STENT CONTOUR 6FRX24X.038 (STENTS) ×2 IMPLANT
STONE CATCHER W/TUBE ADAPTER (UROLOGICAL SUPPLIES) ×3 IMPLANT
SURGIFLO W/THROMBIN 8M KIT (HEMOSTASIS) ×2 IMPLANT
SUT SILK 2 0 30  PSL (SUTURE) ×2
SUT SILK 2 0 30 PSL (SUTURE) ×1 IMPLANT
SYR 10ML LL (SYRINGE) ×3 IMPLANT
SYR 20CC LL (SYRINGE) ×6 IMPLANT
TRAY FOLEY MTR SLVR 14FR STAT (SET/KITS/TRAYS/PACK) ×2 IMPLANT
TRAY FOLEY MTR SLVR 16FR STAT (SET/KITS/TRAYS/PACK) ×1 IMPLANT
TUBING CONNECTING 10 (TUBING) ×4 IMPLANT
TUBING CONNECTING 10' (TUBING) ×2
TUBING UROLOGY SET (TUBING) ×3 IMPLANT
WATER STERILE IRR 1000ML POUR (IV SOLUTION) ×3 IMPLANT

## 2018-06-22 NOTE — Op Note (Signed)
Preoperative diagnosis: 22.5 mm left renal pelvic stone  Postoperative diagnosis: Same  Principal procedure: Percutaneous nephrolithotomy 22 point pelvic stone, placement of 6 French by 24 cm double-J stent without tether  Surgeon  Anesthesia: General endotracheal  Complications: None  Specimen: Stone fragments  Estimated blood loss less than 100 mL  Drains: 14 French Foley catheter  Indications: 58 year old female with recurrent urolithiasis.  She was recently found, on presentation hematuria, flank pain, to have a 22.5 x 11.5 cm left renal pelvic stone.  There was mild left pyelocaliectasis.  Because of the presence of the stone and her symptoms, it was recommended that, with size, we performed percutaneous management of this.  She has had prior percutaneous procedures bilaterally.  She is aware of the risks and complications of the procedure and desires to proceed.  Access to the left renal pelvis this was performed earlier this morning by Dr. Markus Daft.  Description of procedure: Patient was properly identified and marked in the holding area.  She received preoperative IV Ancef prior to her percutaneous access.  She was brought to the operating room general endotracheal anesthetic was performed.  Her bladder was drained with a 14 French Foley catheter.  She was then transferred to the prone position on the operating room table.  All extremities, joints and pressure points were padded appropriately.  Her left flank was prepped and draped around the percutaneous tube.  Proper timeout was performed.  A Super Stiff 0.038 inch guidewire was advanced through the Kumpe catheter, and guided into the bladder fluoroscopically.  The Kumpe tube was removed.  I then incised along the guidewire, and sharply dissected into the subcutaneous tissue with hemostatic.  I then passed a 10 French catheter over top of the existing guidewire, and is passed easily into the upper ureter.  The core was removed and a  second guidewire was then placed into the bladder using fluoroscopic guidance.  Peel-away sheath was then removed.  I passed the 7 [place] NephroMax catheter fluoroscopically right up to the stone.  The balloon was inflated to 20 atm for approximately 3 minutes.  I then passed the access sheath over top of the balloon, into the renal pelvis fluoroscopically.  The balloon was deflated and then removed.  The nephroscope was then passed into the renal pelvis.  The large stone was easily identified.  The stone was fragmented into multiple smaller fragments with the trilogy lithotrite.  These fragments were easily removed.  Smaller fragments were then aspirated with the suction.  No further fragments were seen at this point.  I then passed the flexible nephroscope, and inspected the calyceal system.  No stones were found remaining.  There were no remaining test fragments either.  At this point, I removed the flexible scope.  The rigid scope was passed over top of the working guidewire.  A 24 centimeter a 6 [place] contour double-J stent was then placed fluoroscopically, down into the bladder.  Once adequately positioned the guidewire was removed and excellent curls were seen both in the renal pelvis and the bladder fluoroscopically.  There being no further stone material, the procedure was then terminated.  I measured the distance between the renal parenchyma and the skin, and flow seal was placed within this tract, from the to the skin.  Doing this, the access sheath was then reapproximated using 2-0 silk placed in a vertical mattress fashion.  Dry sterile dressings were then placed.  At this point, the procedure was terminated.  The patient  was then transferred to the supine position, extubated, and taken to the PACU in stable condition.  She tolerated procedure well.  Sponge needle instrument counts were correct x2.

## 2018-06-22 NOTE — Anesthesia Postprocedure Evaluation (Signed)
Anesthesia Post Note  Patient: Dana Townsend  Procedure(s) Performed: NEPHROLITHOTOMY PERCUTANEOUS (Left )     Patient location during evaluation: PACU Anesthesia Type: General Level of consciousness: awake and alert Pain management: pain level controlled Vital Signs Assessment: post-procedure vital signs reviewed and stable Respiratory status: spontaneous breathing, nonlabored ventilation, respiratory function stable and patient connected to nasal cannula oxygen Cardiovascular status: blood pressure returned to baseline and stable Postop Assessment: no apparent nausea or vomiting Anesthetic complications: no    Last Vitals:  Vitals:   06/22/18 1436 06/22/18 1741  BP: (!) 163/77 (!) 188/83  Pulse: 70 67  Resp: 12 15  Temp: 36.6 C 36.5 C  SpO2: 100% 100%    Last Pain:  Vitals:   06/22/18 1824  TempSrc:   PainSc: 4                  Talib Headley

## 2018-06-22 NOTE — Progress Notes (Signed)
Dr. Diona Fanti notified order given for IV tylenol, patient putting out red colored urine no clots via foley. IV tylenol infusing.

## 2018-06-22 NOTE — H&P (Signed)
Chief Complaint: Patient was seen in consultation today for left renal calculus.   Referring Physician(s): Fowler  Supervising Physician: Markus Daft  Patient Status: [facility] - Out-pt  History of Present Illness: Dana Townsend is a 58 y.o. female with a past medical history of hypertension, SVT, HF, hypercholesterolemia, asthma, renal insufficiency, nephrolithiasis, sickle cell anemia, multiple myeloma, diabetes mellitus type II, and vitamin D deficiency. She has a history of nephrolithiasis managed by Dr. Diona Fanti. She has tentative plans for percutaneous left nephrolithotomy today in OR with Dr. Diona Fanti.  IR requested by Dr. Diona Fanti for possible image-guided percutaneous left nephroureteral stent placement for access prior to OR. Patient awake and alert laying in bed with no complaints at this time. Denies fever, chills, chest pain, dyspnea, abdominal pain, dizziness, or headache.   Past Medical History:  Diagnosis Date  . Cancer ([place])   . Complication of anesthesia    Difficult to arouse  . Diabetes mellitus type II, controlled, with no complications ([place])   . Dyspepsia   . Dysrhythmia    runs SVT/VT   WITH ANXIETY     . Hypertension   . Kidney stones   . Menopausal symptoms   . Pure hypercholesterolemia   . Supraventricular tachycardia ([place])   . Vitamin D deficiency     Past Surgical History:  Procedure Laterality Date  . CESAREAN SECTION    . CHOLECYSTECTOMY N/A 04/28/2016   Procedure: LAPAROSCOPIC CHOLECYSTECTOMY WITH INTRAOPERATIVE CHOLANGIOGRAM;  Surgeon: Excell Seltzer, MD;  Location: [place];  Service: General;  Laterality: N/A;  . GANGLION CYST EXCISION    . HISTORY KIDNEY STONES    . KIDNEY STONE SURGERY     left and right  . LAPAROSCOPY    . MOHS SURGERY     FACE    Allergies: Epinephrine; Erythromycin base; and Dilaudid [hydromorphone hcl]  Medications: Prior to Admission medications   Medication Sig Start Date End Date Taking?  Authorizing Provider  Acetylcysteine (N-ACETYL-L-CYSTEINE) 600 MG CAPS Take 600 mg by mouth daily with lunch.    [provider]  b complex vitamins tablet Take 1 tablet by mouth daily with lunch.    [provider]  Calcium Carb-Cholecalciferol (CALCIUM 600+D3 PO) 1 tbs by mouth daily    [provider]  Cholecalciferol (VITAMIN D-3) 125 MCG (5000 UT) TABS Take 5,000-10,000 Units by mouth daily with lunch.    [provider]  Coenzyme Q10 (COQ10 PO) Take 1 capsule by mouth daily with lunch.     [provider]  Multiple Minerals-Vitamins (CAL MAG ZINC +D3 PO) Take 15 mLs by mouth every evening.    [provider]  Multiple Vitamin (MULTIVITAMIN WITH MINERALS) TABS tablet Take 1 tablet by mouth daily with lunch.    [provider]  Multiple Vitamins-Minerals (LUTEIN-ZEAXANTHIN PO) Take 1 capsule by mouth daily with lunch.    [provider]  NON FORMULARY Take 1 capsule by mouth daily. Plexus Bio Cleanse   Bioflavonoid Complex  (quince, orange peel, lemon peel) - '50mg'$ . Sodium - '50mg'$ . Magnesium - '380mg'$ . Ascorbic Acid (Vitamin C) - '150mg'$ . Bioflavoniod Complex - '50mg'$ .    [provider]  Omega-3 Fatty Acids (OMEGA 3 PO) Take 1 capsule by mouth daily with lunch. PLANT BASED    [provider]  Probiotic Product (PROBIOTIC PO) Take 1 capsule by mouth daily with lunch.     [provider]  TURMERIC PO Take 1 capsule by mouth daily with lunch.    [provider]  vitamin C (ASCORBIC ACID) 500 MG tablet Take 500 mg by mouth daily with lunch.    [provider]  vitamin E 400 UNIT capsule Take 400 Units by mouth daily with lunch.    [provider]     Family History  Problem Relation Age of Onset  . Adrenal disorder Mother   . Hypertension Father   . Alzheimer's disease Father   . Kidney cancer Father     Social History   Socioeconomic History  . Marital status:  Married    Spouse name: Not on file  . Number of children: Not on file  . Years of education: Not on file  . Highest education level: Not on file  Occupational History  . Not on file  Social Needs  . Financial resource strain: Not on file  . Food insecurity:    Worry: Not on file    Inability: Not on file  . Transportation needs:    Medical: Not on file    Non-medical: Not on file  Tobacco Use  . Smoking status: Never Smoker  . Smokeless tobacco: Never Used  Substance and Sexual Activity  . Alcohol use: No  . Drug use: No  . Sexual activity: Not on file  Lifestyle  . Physical activity:    Days per week: Not on file    Minutes per session: Not on file  . Stress: Not on file  Relationships  . Social connections:    Talks on phone: Not on file    Gets together: Not on file    Attends religious service: Not on file    Active member of club or organization: Not on file    Attends meetings of clubs or organizations: Not on file    Relationship status: Not on file  Other Topics Concern  . Not on file  Social History Narrative  . Not on file     Review of Systems: A 12 point ROS discussed and pertinent positives are indicated in the HPI above.  All other systems are negative.  Review of Systems  Constitutional: Negative for chills and fever.  Respiratory: Negative for shortness of breath and wheezing.   Cardiovascular: Negative for chest pain and palpitations.  Gastrointestinal: Negative for abdominal pain.  Neurological: Negative for dizziness and headaches.  Psychiatric/Behavioral: Negative for behavioral problems and confusion.    Vital Signs: BP (!) 169/86   Pulse 69   Temp 98.4 F (36.9 C) (Oral)   Resp 18   SpO2 99%   Physical Exam Vitals signs and nursing note reviewed.  Constitutional:      General: She is not in acute distress.    Appearance: Normal appearance.  Cardiovascular:     Rate and Rhythm: Normal rate and regular rhythm.     Heart sounds:  Normal heart sounds. No murmur.  Pulmonary:     Effort: Pulmonary effort is normal. No respiratory distress.     Breath sounds: Normal breath sounds. No wheezing.  Skin:    General: Skin is warm and dry.  Neurological:     Mental Status: She is alert and oriented to person, place, and time.  Psychiatric:        Mood and Affect: Mood normal.        Behavior: Behavior normal.        Thought Content: Thought content normal.        Judgment: Judgment normal.      MD Evaluation Airway: WNL  Heart: WNL Abdomen: WNL Chest/ Lungs: WNL ASA  Classification: 3 Mallampati/Airway Score: One   Labs:  CBC: Recent Labs    06/19/18 1015 06/22/18 0830  WBC 6.7 6.8  HGB 15.1* 14.9  HCT 46.6* 46.0  PLT 269 268    COAGS: Recent Labs    06/22/18 0830  INR 0.95    BMP: Recent Labs    06/19/18 1015  NA 140  K 4.0  CL 107  CO2 27  GLUCOSE 131*  BUN 13  CALCIUM 8.8*  CREATININE 0.72  GFRNONAA >60  GFRAA >60    Assessment and Plan:  Left renal calculus. Plan for image-guided percutaneous left nephroureteral stent placement today with Dr. Anselm Pancoast. Patient is NPO. Afebrile and WBCs WNL. She does not take blood thinners. INR 0.95 seconds today.  Risks and benefits of nephroureteral stent placement discussed with the patient including, but not limited to bleeding, infection which may lead to sepsis or even death and damage to adjacent structures. All of the patient's questions were answered, patient is agreeable to proceed. Consent signed and in chart.   Thank you for this interesting consult.  I greatly enjoyed meeting Dana Townsend and look forward to participating in their care.  A copy of this report was sent to the requesting provider on this date.  Electronically Signed: Earley Abide, PA-C 06/22/2018, 9:26 AM   I spent a total of 40 Minutes in face to face in clinical consultation, greater than 50% of which was counseling/coordinating care for left renal  calculus.

## 2018-06-22 NOTE — Interval H&P Note (Signed)
History and Physical Interval Note:  06/22/2018 11:20 AM  Dana Townsend  has presented today for surgery, with the diagnosis of LEFT RENAL STONE  The various methods of treatment have been discussed with the patient and family. After consideration of risks, benefits and other options for treatment, the patient has consented to  Procedure(s) with comments: NEPHROLITHOTOMY PERCUTANEOUS (Left) - 3 HRS HOLMIUM LASER APPLICATION (Left) as a surgical intervention .  The patient's history has been reviewed, patient examined, no change in status, stable for surgery.  I have reviewed the patient's chart and labs.  Questions were answered to the patient's satisfaction.     Lillette Boxer [name]

## 2018-06-22 NOTE — Progress Notes (Signed)
Patient C/O sever abdominal pain post post op, PRN morphine/oxybutinin and Oxy-IR given without any relief, continie to C/O bladder spasm and back pain, bladder scan done 0cc. Will continue to assess patient.

## 2018-06-22 NOTE — Procedures (Signed)
Interventional Radiology Procedure:   Indications: Large left renal pelvic stone and scheduled for OR and stone removal.  Procedure: Left nephroureteral catheter placement  Findings: Large stone in left renal pelvis, no significant hydronephrosis.  5 Fr catheter placed in lower pole calyx and tip in bladder.  Complications: None     EBL: Minimal  Plan: To OR for stone removal by Dr. Diona Fanti.   Dana Everly R. Anselm Pancoast, MD  Pager: [phone]

## 2018-06-22 NOTE — H&P (Signed)
H&P  Chief Complaint: Left-sided kidney stone  History of Present Illness: Dana Townsend is a 58 y.o. year old female who presents this time for percutaneous management of a 22.5 mm left renal pelvic stone.  She recently presented with flank pain and intermittent hematuria.  Evaluation revealed the large stone which is recurrent.  Because of the size of this, I have strongly recommended percutaneous management.  I discussed the procedure as well as risks and complications (including bleeding, need for transfusion, loss of kidney, need for second/staged procedure, anesthetic complications, urinary tract infections).  She understands these and desires to proceed.  Past Medical History:  Diagnosis Date  . Cancer ([place])   . Complication of anesthesia    Difficult to arouse  . Diabetes mellitus type II, controlled, with no complications ([place])   . Dyspepsia   . Dysrhythmia    runs SVT/VT   WITH ANXIETY     . Hypertension   . Kidney stones   . Menopausal symptoms   . Pure hypercholesterolemia   . Supraventricular tachycardia ([place])   . Vitamin D deficiency     Past Surgical History:  Procedure Laterality Date  . CESAREAN SECTION    . CHOLECYSTECTOMY N/A 04/28/2016   Procedure: LAPAROSCOPIC CHOLECYSTECTOMY WITH INTRAOPERATIVE CHOLANGIOGRAM;  Surgeon: Excell Seltzer, MD;  Location: [place];  Service: General;  Laterality: N/A;  . GANGLION CYST EXCISION    . HISTORY KIDNEY STONES    . KIDNEY STONE SURGERY     left and right  . LAPAROSCOPY    . MOHS SURGERY     FACE    Home Medications:  Medications Prior to Admission  Medication Sig Dispense Refill  . Acetylcysteine (N-ACETYL-L-CYSTEINE) 600 MG CAPS Take 600 mg by mouth daily with lunch.    . b complex vitamins tablet Take 1 tablet by mouth daily with lunch.    . Calcium Carb-Cholecalciferol (CALCIUM 600+D3 PO) 1 tbs by mouth daily    . Cholecalciferol (VITAMIN D-3) 125 MCG (5000 UT) TABS Take 5,000-10,000 Units by mouth daily  with lunch.    . Coenzyme Q10 (COQ10 PO) Take 1 capsule by mouth daily with lunch.     . Multiple Minerals-Vitamins (CAL MAG ZINC +D3 PO) Take 15 mLs by mouth every evening.    . Multiple Vitamin (MULTIVITAMIN WITH MINERALS) TABS tablet Take 1 tablet by mouth daily with lunch.    . Multiple Vitamins-Minerals (LUTEIN-ZEAXANTHIN PO) Take 1 capsule by mouth daily with lunch.    . NON FORMULARY Take 1 capsule by mouth daily. Plexus Bio Cleanse   Bioflavonoid Complex  (quince, orange peel, lemon peel) - 50mg . Sodium - 50mg . Magnesium - 380mg . Ascorbic Acid (Vitamin C) - 150mg . Bioflavoniod Complex - 50mg .    . Omega-3 Fatty Acids (OMEGA 3 PO) Take 1 capsule by mouth daily with lunch. PLANT BASED    . Probiotic Product (PROBIOTIC PO) Take 1 capsule by mouth daily with lunch.     . TURMERIC PO Take 1 capsule by mouth daily with lunch.    . vitamin C (ASCORBIC ACID) 500 MG tablet Take 500 mg by mouth daily with lunch.    . vitamin E 400 UNIT capsule Take 400 Units by mouth daily with lunch.      Allergies:  Allergies  Allergen Reactions  . Epinephrine   . Erythromycin Base   . Dilaudid [Hydromorphone Hcl] Other (See Comments)    "made me feel out of it" pt states--didn't like the way it made her  feel    Family History  Problem Relation Age of Onset  . Adrenal disorder Mother   . Hypertension Father   . Alzheimer's disease Father   . Kidney cancer Father     Social History:  reports that she has never smoked. She has never used smokeless tobacco. She reports that she does not drink alcohol or use drugs.  ROS: A complete review of systems was performed.  All systems are negative except for pertinent findings as noted.  Physical Exam:  Vital signs in last 24 hours: Temp:  [98.4 F (36.9 C)] 98.4 F (36.9 C) (02/20 0813) Pulse Rate:  [69] 69 (02/20 0813) Resp:  [18] 18 (02/20 0813) BP: (169)/(86) 169/86 (02/20 0813) SpO2:  [99 %] 99 % (02/20 0813) General:  Alert and oriented,  No acute distress HEENT: Normocephalic, atraumatic Neck: No JVD or lymphadenopathy Cardiovascular: Regular rate and rhythm Lungs: Clear bilaterally Abdomen: Soft, nontender, nondistended, no abdominal masses Back: No CVA tenderness Extremities: No edema Neurologic: Grossly intact  Laboratory Data:  Results for orders placed or performed during the hospital encounter of 06/22/18 (from the past 24 hour(s))  CBC with Differential/Platelet     Status: None   Collection Time: 06/22/18  8:30 AM  Result Value Ref Range   WBC 6.8 4.0 - 10.5 K/uL   RBC 5.07 3.87 - 5.11 MIL/uL   Hemoglobin 14.9 12.0 - 15.0 g/dL   HCT 46.0 36.0 - 46.0 %   MCV 90.7 80.0 - 100.0 fL   MCH 29.4 26.0 - 34.0 pg   MCHC 32.4 30.0 - 36.0 g/dL   RDW 13.3 11.5 - 15.5 %   Platelets 268 150 - 400 K/uL   nRBC 0.0 0.0 - 0.2 %   Neutrophils Relative % 54 %   Neutro Abs 3.7 1.7 - 7.7 K/uL   Lymphocytes Relative 31 %   Lymphs Abs 2.1 0.7 - 4.0 K/uL   Monocytes Relative 10 %   Monocytes Absolute 0.7 0.1 - 1.0 K/uL   Eosinophils Relative 4 %   Eosinophils Absolute 0.3 0.0 - 0.5 K/uL   Basophils Relative 1 %   Basophils Absolute 0.0 0.0 - 0.1 K/uL   Immature Granulocytes 0 %   Abs Immature Granulocytes 0.02 0.00 - 0.07 K/uL   No results found for this or any previous visit (from the past 240 hour(s)). Creatinine: Recent Labs    06/19/18 1015  CREATININE 0.72    Radiologic Imaging: Recent CT revealed a 22.5 x 12 mm left renal pelvic stone Impression/Assessment:  Large left renal pelvic stone  Plan:  Left percutaneous nephrolithotomy  Lillette Boxer Takuya Lariccia 06/22/2018, 8:55 AM  Lillette Boxer. Kristian Hazzard MD

## 2018-06-22 NOTE — Transfer of Care (Signed)
Immediate Anesthesia Transfer of Care Note  Patient: Dana Townsend  Procedure(s) Performed: NEPHROLITHOTOMY PERCUTANEOUS (Left )  Patient Location: PACU  Anesthesia Type:General  Level of Consciousness: awake  Airway & Oxygen Therapy: Patient Spontanous Breathing and Patient connected to face mask oxygen  Post-op Assessment: Report given to RN and Post -op Vital signs reviewed and stable  Post vital signs: Reviewed and stable  Last Vitals:  Vitals Value Taken Time  BP 175/109 06/22/2018  1:34 PM  Temp    Pulse 84 06/22/2018  1:35 PM  Resp 16 06/22/2018  1:35 PM  SpO2 99 % 06/22/2018  1:35 PM  Vitals shown include unvalidated device data.  Last Pain:  Vitals:   06/22/18 1040  TempSrc: Oral  PainSc: Asleep         Complications: No apparent anesthesia complications

## 2018-06-22 NOTE — Anesthesia Procedure Notes (Signed)
Procedure Name: Intubation Date/Time: 06/22/2018 11:43 AM Performed by: Sharlette Dense, CRNA Patient Re-evaluated:Patient Re-evaluated prior to induction Oxygen Delivery Method: Circle system utilized Preoxygenation: Pre-oxygenation with 100% oxygen Induction Type: IV induction Ventilation: Mask ventilation without difficulty and Oral airway inserted - appropriate to patient size Laryngoscope Size: Sabra Heck and 2 Grade View: Grade I Tube type: Oral Tube size: 7.5 mm Number of attempts: 1 Airway Equipment and Method: Stylet Placement Confirmation: ETT inserted through vocal cords under direct vision,  positive ETCO2 and breath sounds checked- equal and bilateral Secured at: 21 cm Tube secured with: Tape Dental Injury: Teeth and Oropharynx as per pre-operative assessment

## 2018-06-23 ENCOUNTER — Encounter (HOSPITAL_COMMUNITY): Payer: Self-pay | Admitting: Urology

## 2018-06-23 DIAGNOSIS — N200 Calculus of kidney: Secondary | ICD-10-CM | POA: Diagnosis not present

## 2018-06-23 LAB — HEMOGLOBIN AND HEMATOCRIT, BLOOD
HCT: 41.6 % (ref 36.0–46.0)
Hemoglobin: 13.6 g/dL (ref 12.0–15.0)

## 2018-06-23 LAB — URINE CULTURE: CULTURE: NO GROWTH

## 2018-06-23 MED ORDER — OXYBUTYNIN CHLORIDE 5 MG PO TABS: 5.0000 mg | ORAL_TABLET | Freq: Three times a day (TID) | ORAL | 1 refills | Status: DC | PRN

## 2018-06-23 NOTE — Discharge Summary (Signed)
Patient ID: Dana Townsend MRN: [id number] DOB/AGE: Mar 28, 1961 58 y.o.  Admit date: 06/22/2018 Discharge date: 06/23/2018  Primary Care Physician:  Shirline Frees, MD  Discharge Diagnoses:  n20.0   Consults:  None   Discharge Medications: Allergies as of 06/23/2018      Reactions   Epinephrine    Erythromycin Base    Dilaudid [hydromorphone Hcl] Other (See Comments)   "made me feel out of it" pt states--didn't like the way it made her feel      Medication List    TAKE these medications   b complex vitamins tablet Take 1 tablet by mouth daily with lunch.   CAL MAG ZINC +D3 PO Take 15 mLs by mouth every evening.   CALCIUM 600+D3 PO 1 tbs by mouth daily   cephALEXin 500 MG capsule Commonly known as:  KEFLEX Take 1 capsule (500 mg total) by mouth 2 (two) times daily.   COQ10 PO Take 1 capsule by mouth daily with lunch.   LUTEIN-ZEAXANTHIN PO Take 1 capsule by mouth daily with lunch.   multivitamin with minerals Tabs tablet Take 1 tablet by mouth daily with lunch.   N-Acetyl-L-Cysteine 600 MG Caps Take 600 mg by mouth daily with lunch.   NON FORMULARY Take 1 capsule by mouth daily. Plexus Bio Cleanse   Bioflavonoid Complex  (quince, orange peel, lemon peel) - 50mg . Sodium - 50mg . Magnesium - 380mg . Ascorbic Acid (Vitamin C) - 150mg . Bioflavoniod Complex - 50mg .   OMEGA 3 PO Take 1 capsule by mouth daily with lunch. PLANT BASED   oxybutynin 5 MG tablet Commonly known as:  DITROPAN Take 1 tablet (5 mg total) by mouth every 8 (eight) hours as needed for bladder spasms.   oxyCODONE 5 MG immediate release tablet Commonly known as:  ROXICODONE Take 1 tablet (5 mg total) by mouth every 6 (six) hours as needed for severe pain.   PROBIOTIC PO Take 1 capsule by mouth daily with lunch.   TURMERIC PO Take 1 capsule by mouth daily with lunch.   vitamin C 500 MG tablet Commonly known as:  ASCORBIC ACID Take 500 mg by mouth daily with lunch.   Vitamin D-3  125 MCG (5000 UT) Tabs Take 5,000-10,000 Units by mouth daily with lunch.   vitamin E 400 UNIT capsule Take 400 Units by mouth daily with lunch.        Significant Diagnostic Studies:  Dg C-arm 1-60 Min-no Report  Result Date: 06/22/2018 Fluoroscopy was utilized by the requesting physician.  No radiographic interpretation.   Ir Ureteral Stent Placement Existing Access Left  Result Date: 06/22/2018 INDICATION: 58 year old with a large left renal calculus. Patient is scheduled for percutaneous nephrolithotomy and needs percutaneous access. EXAM: PLACEMENT OF LEFT NEPHROURETERAL CATHETER WITH ULTRASOUND AND FLUOROSCOPIC GUIDANCE COMPARISON:  None. MEDICATIONS: Ancef 2 g; The antibiotic was administered in an appropriate time frame prior to skin puncture. ANESTHESIA/SEDATION: Fentanyl 100 mcg IV; Versed 4.0 mg IV Moderate Sedation Time:  36 minutes The patient was continuously monitored during the procedure by the interventional radiology nurse under my direct supervision. CONTRAST:  20 mL Isovue-300-administered into the collecting system(s) FLUOROSCOPY TIME:  Fluoroscopy Time: 5 minutes 24 seconds (105 mGy). COMPLICATIONS: None immediate. PROCEDURE: Informed written consent was obtained from the patient after a thorough discussion of the procedural risks, benefits and alternatives. All questions were addressed. Maximal Sterile Barrier Technique was utilized including caps, mask, sterile gowns, sterile gloves, sterile drape, hand hygiene and skin antiseptic. A timeout was performed prior  to the initiation of the procedure. Left flank was prepped and draped in sterile fashion. Fluoroscopy demonstrated a large left renal pelvic stone. Ultrasound also demonstrated a large left renal pelvic stone without significant hydronephrosis. Skin was anesthetized with 1% lidocaine. 21 gauge needle was directed into the renal pelvis using ultrasound guidance. Contrast injection confirmed placement in the renal  collecting system. The renal collecting system was opacified with contrast and air. A posterior lower pole calyx was targeted with a second needle. The second needle was directed into the lower pole calyx with fluoroscopic guidance. Contrast injection confirmed placement in the lower pole calyx. A 0.018 wire was easily advanced into the collecting system and dilator set was placed. Subsequently, 5 French catheter was advanced down the left ureter with a Bentson wire. Catheter was positioned in the urinary bladder. Catheter was sutured to skin and capped. Fluoroscopic and ultrasound images were taken and saved for documentation. FINDINGS: Large stone in the left renal pelvis with minimal hydronephrosis. Percutaneous access was obtained from a posterior lower pole calyx. 5 French catheter was advanced into the urinary bladder. IMPRESSION: Successful placement of a left nephroureteral catheter. Electronically Signed   By: Markus Daft M.D.   On: 06/22/2018 12:41    Brief H and P: For complete details please refer to admission H and P, but in brief pt admitted for mgmt of a large left renal stone  Hospital Course: No postop issues--d/ced on POD 1 Active Problems:   Calculus of kidney   Day of Discharge BP 126/65 (BP Location: Left Arm)   Pulse 76   Temp 98.1 F (36.7 C) (Oral)   Resp 17   Ht 5\' 3"  (1.6 m)   Wt 106 kg   SpO2 97%   BMI 41.40 kg/m   Results for orders placed or performed during the hospital encounter of 06/22/18 (from the past 24 hour(s))  Urinalysis, Routine w reflex microscopic     Status: Abnormal   Collection Time: 06/22/18  1:26 PM  Result Value Ref Range   Color, Urine YELLOW YELLOW   APPearance HAZY (A) CLEAR   Specific Gravity, Urine 1.027 1.005 - 1.030   pH 7.0 5.0 - 8.0   Glucose, UA NEGATIVE NEGATIVE mg/dL   Hgb urine dipstick LARGE (A) NEGATIVE   Bilirubin Urine NEGATIVE NEGATIVE   Ketones, ur NEGATIVE NEGATIVE mg/dL   Protein, ur 30 (A) NEGATIVE mg/dL    Nitrite NEGATIVE NEGATIVE   Leukocytes,Ua NEGATIVE NEGATIVE   RBC / HPF >50 (H) 0 - 5 RBC/hpf   Bacteria, UA NONE SEEN NONE SEEN   Squamous Epithelial / LPF 0-5 0 - 5   Mucus PRESENT   Glucose, capillary     Status: Abnormal   Collection Time: 06/22/18  1:40 PM  Result Value Ref Range   Glucose-Capillary 148 (H) 70 - 99 mg/dL  Glucose, capillary     Status: Abnormal   Collection Time: 06/22/18  4:32 PM  Result Value Ref Range   Glucose-Capillary 219 (H) 70 - 99 mg/dL  Glucose, capillary     Status: Abnormal   Collection Time: 06/22/18  9:43 PM  Result Value Ref Range   Glucose-Capillary 229 (H) 70 - 99 mg/dL  Hemoglobin and hematocrit, blood     Status: None   Collection Time: 06/23/18  4:07 AM  Result Value Ref Range   Hemoglobin 13.6 12.0 - 15.0 g/dL   HCT 41.6 36.0 - 46.0 %    Physical Exam: General: Alert and awake  oriented x3 not in any acute distress. HEENT: anicteric sclera, pupils reactive to light and accommodation CVS: S1-S2 clear no murmur rubs or gallops Chest: clear to auscultation bilaterally, no wheezing rales or rhonchi Abdomen: soft nontender, nondistended, normal bowel sounds, no organomegaly Extremities: no cyanosis, clubbing or edema noted bilaterally Neuro: Cranial nerves II-XII intact, no focal neurological deficits  Disposition:  Home  Diet:  Regular  Activity:  Discussed w/ pt     DISCHARGE FOLLOW-UP  Follow-up Information    Jed Limerick, NP.   Specialty:  Urology Why:  3.5.202 @ 9 am Contact information: [address] [place] [address] [phone]           Time spent on Discharge:   15 mins  Signed: Lillette Boxer Genowefa Morga 06/23/2018, 9:19 AM

## 2018-06-23 NOTE — Progress Notes (Signed)
Pt discharged to home, instructions reviewed with patients, pt acknowledged understanding. Pt continues to have blood tinged urine without clots. C/O of spasms medicated earlier. SRP, RN

## 2018-06-23 NOTE — Discharge Instructions (Signed)
DISCHARGE INSTRUCTIONS FOR PERCUTANEOUS STONE SURGERY MEDICATIONS:  1. DO NOT RESUME YOUR IBUPROFEN, or any other medicines like aspirin, motrin, excedrin, advil, aleve, vitamin E, fish oil as these can all cause bleeding x 10 days.  2. Resume all your other meds from home - except do not take any other pain meds that you may have at home.  ACTIVITY 1. No strenuous activity, sexual activity, or lifting greater than 10 pounds for 2 weeks. 2. No driving while on narcotic pain medications 3. Drink plenty of water 4. Continue to walk at home - you can still get blood clots when you are at home, so keep active, but don't over do it. 5. May return to work in 1 week (but not heavy or strenuous activity).  6. It is OK to start massage therapy on 3.5.2020  BATHING You can shower.  Cover your wound with a dressing and remove the dressing immediately after the shower.  Do not submerge wound under water.   WOUND CARE Your wound will drain bloody fluid and may do so for 7-14 days. You have 2 options for dressings:  1. You may use gauze and tape to dress your wound.  If you choose this method, then change the dressing as it becomes soaked.  Change it at least once daily until it stops draining. You may switch to a Band Aid once drainage stops. 2. If drainage is copious, you may use an ostomy device.  This is a bag with an andhesive circle.  The circle has a hole in the middle of it and you cut the hole to the size needed to fit the wound.  This will collect the drainage in the bag and allow you to drain the bag as needed.   SIGNS/SYMPTOMS TO CALL: 1. Please call us if you have a fever greater than 101.5, uncontrolled nausea/vomiting, uncontrolled pain, dizziness, unable to urinate, bloody urine, chest pain, shortness of breath, leg swelling, leg pain, redness around wound, drainage from wound, or any other concerns or questions. 2. You can reach [place] at [phone]. FOLLOW-UP .

## 2018-09-06 ENCOUNTER — Telehealth: Payer: Self-pay | Admitting: *Deleted

## 2018-09-06 NOTE — Telephone Encounter (Signed)

## 2018-09-07 ENCOUNTER — Telehealth (INDEPENDENT_AMBULATORY_CARE_PROVIDER_SITE_OTHER): Payer: Commercial Managed Care - PPO | Admitting: Cardiology

## 2018-09-07 ENCOUNTER — Other Ambulatory Visit: Payer: Self-pay

## 2018-09-07 DIAGNOSIS — I471 Supraventricular tachycardia: Secondary | ICD-10-CM | POA: Diagnosis not present

## 2018-09-07 NOTE — Progress Notes (Signed)
Electrophysiology TeleHealth Note   Due to national recommendations of social distancing due to COVID 19, an audio/video telehealth visit is felt to be most appropriate for this patient at this time.  See Epic message for the patient's consent to telehealth for [facility].   Date:  09/07/2018   ID:  Dana Townsend, DOB [birth year], MRN [id number]  Location: patient's home  Provider location: [address] [place]  Evaluation Performed: Follow-up visit  PCP:  Shirline Frees, MD  Cardiologist:  Tonya Wantz Meredith Leeds, MD  Electrophysiologist:  Dr Curt Bears  Chief Complaint:  SVT  History of Present Illness:    Dana Townsend is a 58 y.o. female who presents via audio/video conferencing for a telehealth visit today.  Since last being seen in our clinic, the patient reports doing very well.  Today, she denies symptoms of palpitations, chest pain, shortness of breath,  lower extremity edema, dizziness, presyncope, or syncope.  The patient is otherwise without complaint today.  The patient denies symptoms of fevers, chills, cough, or new SOB worrisome for COVID 19.  She has a history of diabetes, hyperlipidemia, vitamin D deficiency, and SVT.  She recently presented to the hospital at [facility] with an episode of SVT.  The SVT was a long RP tachycardia.  She received adenosine and converted to sinus rhythm.  Her troponin rose to 1.28.  She had an echo that showed an ejection fraction of 55 to 60% without regional wall motion abnormalities.  Her elevated troponin was thought to be demand ischemia in the setting of heart rate of 216 for 2 hours.  Today, denies symptoms of palpitations, chest pain, shortness of breath, orthopnea, PND, lower extremity edema, claudication, dizziness, presyncope, syncope, bleeding, or neurologic sequela. The patient is tolerating medications without difficulties.  Since her hospitalization, she has not had any further episodes of SVT.  She was  put on metoprolol at that time.  Past Medical History:  Diagnosis Date  . Cancer ([place])   . Complication of anesthesia    Difficult to arouse  . Diabetes mellitus type II, controlled, with no complications ([place])   . Dyspepsia   . Dysrhythmia    runs SVT/VT   WITH ANXIETY     . Hypertension   . Kidney stones   . Menopausal symptoms   . Pure hypercholesterolemia   . Supraventricular tachycardia ([place])   . Vitamin D deficiency     Past Surgical History:  Procedure Laterality Date  . CESAREAN SECTION    . CHOLECYSTECTOMY N/A 04/28/2016   Procedure: LAPAROSCOPIC CHOLECYSTECTOMY WITH INTRAOPERATIVE CHOLANGIOGRAM;  Surgeon: Excell Seltzer, MD;  Location: [facility];  Service: General;  Laterality: N/A;  . GANGLION CYST EXCISION    . HISTORY KIDNEY STONES    . IR URETERAL STENT PLACEMENT EXISTING ACCESS LEFT  06/22/2018  . KIDNEY STONE SURGERY     left and right  . LAPAROSCOPY    . [place]  . NEPHROLITHOTOMY Left 06/22/2018   Procedure: NEPHROLITHOTOMY PERCUTANEOUS;  Surgeon: Franchot Gallo, MD;  Location[facility];  Service: Urology;  Laterality: Left;  3 HRS    Current Outpatient Medications  Medication Sig Dispense Refill  . Acetylcysteine (N-ACETYL-L-CYSTEINE) 600 MG CAPS Take 600 mg by mouth daily with lunch.    . b complex vitamins tablet Take 1 tablet by mouth daily with lunch.    . Calcium Carb-Cholecalciferol (CALCIUM 600+D3 PO) 1 tbs by mouth daily    .  cephALEXin (KEFLEX) 500 MG capsule Take 1 capsule (500 mg total) by mouth 2 (two) times daily. 10 capsule 0  . Cholecalciferol (VITAMIN D-3) 125 MCG (5000 UT) TABS Take 5,000-10,000 Units by mouth daily with lunch.    . Coenzyme Q10 (COQ10 PO) Take 1 capsule by mouth daily with lunch.     . metoprolol tartrate (LOPRESSOR) 25 MG tablet Take 25 mg by mouth 2 (two) times daily.    . Multiple Minerals-Vitamins (CAL MAG ZINC +D3 PO) Take 15 mLs by mouth every evening.    . Multiple Vitamin (MULTIVITAMIN WITH  MINERALS) TABS tablet Take 1 tablet by mouth daily with lunch.    . Multiple Vitamins-Minerals (LUTEIN-ZEAXANTHIN PO) Take 1 capsule by mouth daily with lunch.    . NON FORMULARY Take 1 capsule by mouth daily. Plexus Bio Cleanse   Bioflavonoid Complex  (quince, orange peel, lemon peel) - 48m. Sodium - 57m Magnesium - 38073mAscorbic Acid (Vitamin C) - 150m43mioflavoniod Complex - 50mg72m . Omega-3 Fatty Acids (OMEGA 3 PO) Take 1 capsule by mouth daily with lunch. PLANT BASED    . oxybutynin (DITROPAN) 5 MG tablet Take 1 tablet (5 mg total) by mouth every 8 (eight) hours as needed for bladder spasms. 30 tablet 1  . oxyCODONE (ROXICODONE) 5 MG immediate release tablet Take 1 tablet (5 mg total) by mouth every 6 (six) hours as needed for severe pain. 10 tablet 0  . Probiotic Product (PROBIOTIC PO) Take 1 capsule by mouth daily with lunch.     . TURMERIC PO Take 1 capsule by mouth daily with lunch.    . vitamin C (ASCORBIC ACID) 500 MG tablet Take 500 mg by mouth daily with lunch.    . vitamin E 400 UNIT capsule Take 400 Units by mouth daily with lunch.     No current facility-administered medications for this visit.     Allergies:   Epinephrine; Erythromycin base; and Dilaudid [hydromorphone hcl]   Social History:  The patient  reports that she has never smoked. She has never used smokeless tobacco. She reports that she does not drink alcohol or use drugs.   Family History:  The patient's  family history includes Adrenal disorder in her mother; Alzheimer's disease in her father; Hypertension in her father; Kidney cancer in her father.   ROS:  Please see the history of present illness.   All other systems are personally reviewed and negative.    Exam:    Vital Signs:  BP 135/85   Pulse (!) 56   Well appearing, alert and conversant, regular work of breathing,  good skin color Eyes- anicteric, neuro- grossly intact, skin- no apparent rash or lesions or cyanosis, mouth- oral mucosa  is pink  Labs/Other Tests and Data Reviewed:    Recent Labs: 06/19/2018: BUN 13; Creatinine, Ser 0.72; Potassium 4.0; Sodium 140 06/22/2018: Platelets 268 06/23/2018: Hemoglobin 13.6   Wt Readings from Last 3 Encounters:  06/22/18 233 lb 11 oz (106 kg)  06/19/18 227 lb (103 kg)  06/22/17 200 lb (90.7 kg)     Other studies personally reviewed: Additional studies/ records that were reviewed today include: Cardiac monitor 06/22/2017 personally reviewed  Review of the above records today demonstrates:   Sinus rhythm Palpitations associated with SVT appearing atrial tachycardia  ECG 06/19/2018 personally reviewed Sinus rhythm     ASSESSMENT & PLAN:    1.  SVT: Patient does have a history of atrial tachycardia, though her most recent episode  as described does sound like ORT.  It did break with adenosine.  At this point, she would like to move forward with ablation.  Risks and benefits were discussed and include bleeding, tamponade, heart block, stroke.  She understands these risks and is agreed to the procedure.  She would like to have this done before the end of June as she is on [employer] and has already met her co-pay which Dana Townsend be reset in June.  We Dana Townsend try to get results from her ECGs from her hospitalization.  She had an ECG done while in SVT in the ambulance when [facility].  2.  Presyncope: None further  3.  Hypertension: Blood pressure is elevated.  We Dana Townsend plan to stop metoprolol and start carvedilol 6.25 mg.  She may need further blood pressure management.  She Dana Townsend also try to lose weight.   COVID 19 screen The patient denies symptoms of COVID 19 at this time.  The importance of social distancing was discussed today.  Follow-up: 3 months   Current medicines are reviewed at length with the patient today.   The patient does not have concerns regarding her medicines.  The following changes were made today: Stop metoprolol, start carvedilol  Labs/  tests ordered today include:  No orders of the defined types were placed in this encounter.    Patient Risk:  after full review of this patients clinical status, I feel that they are at moderate risk at this time.  Today, I have spent 18 minutes with the patient with telehealth technology discussing SVT, recent hospitalization.    Signed, Emilygrace Grothe Meredith Leeds, MD  09/07/2018 11:42 AM     [facility] [address] [place] [address] [phone] (office) [phone] (fax)

## 2018-09-11 ENCOUNTER — Encounter: Payer: Self-pay | Admitting: *Deleted

## 2018-09-11 MED ORDER — CARVEDILOL 12.5 MG PO TABS: 12.5000 mg | ORAL_TABLET | Freq: Two times a day (BID) | ORAL | 3 refills | Status: DC

## 2018-09-11 NOTE — Telephone Encounter (Signed)
This encounter was created in error - please disregard.

## 2018-09-11 NOTE — Telephone Encounter (Signed)
Advised to stop Lopressor and start Carvedilol 12/5 mg BID, per Dr. Curt Bears. Rx sent to [place]. Patient verbalized understanding and agreeable to plan.

## 2018-10-06 ENCOUNTER — Telehealth: Payer: Self-pay | Admitting: *Deleted

## 2018-10-06 NOTE — Telephone Encounter (Signed)
Called to schedule SVT ablation for this month. Pt is going to review work load over weekend to determine if she can do 6/12 or wait until 6/26. Aware I will f/u Monday for date (she is aware if 6/12 procedure then she will COVID test Tuesday 6/9 and quarantine until procedure)

## 2018-10-10 NOTE — Telephone Encounter (Signed)
Follow up  ° ° °Patient is returning call.  °

## 2018-10-10 NOTE — Telephone Encounter (Signed)
Pt would like ablation 6/18.  Aware I will arrange and call her by end of week to rv instructions and scheduled COVID testing.

## 2018-10-12 NOTE — Telephone Encounter (Signed)
SVT ablation scheduled for 6/18. Procedure instructions reviewed w/ pt.  Aware to arrive to [facility] at 8:30 am COVID screening testing scheduled for 6/15. Screening  instructions rv w/ pt Post procedure f/u scheduled for 8/11. Patient verbalized understanding and agreeable to plan.

## 2018-10-16 ENCOUNTER — Other Ambulatory Visit (HOSPITAL_COMMUNITY)
Admission: RE | Admit: 2018-10-16 | Discharge: 2018-10-16 | Disposition: A | Discharge status: Home / Self Care | Payer: Commercial Managed Care - PPO | Source: Ambulatory Visit | Attending: Cardiology | Admitting: Cardiology

## 2018-10-16 DIAGNOSIS — Z1159 Encounter for screening for other viral diseases: Secondary | ICD-10-CM | POA: Insufficient documentation

## 2018-10-18 LAB — NOVEL CORONAVIRUS, NAA (HOSP ORDER, SEND-OUT TO REF LAB; TAT 18-24 HRS): SARS-CoV-2, NAA: NOT DETECTED

## 2018-10-19 ENCOUNTER — Other Ambulatory Visit: Payer: Self-pay

## 2018-10-19 ENCOUNTER — Encounter (HOSPITAL_COMMUNITY): Payer: Self-pay | Admitting: Anesthesiology

## 2018-10-19 ENCOUNTER — Ambulatory Visit (HOSPITAL_COMMUNITY): Payer: Commercial Managed Care - PPO | Admitting: Anesthesiology

## 2018-10-19 ENCOUNTER — Ambulatory Visit (HOSPITAL_COMMUNITY)
Admission: RE | Admit: 2018-10-19 | Discharge: 2018-10-19 | Disposition: A | Discharge status: Home / Self Care | Payer: Commercial Managed Care - PPO | Attending: Cardiology | Admitting: Cardiology

## 2018-10-19 ENCOUNTER — Encounter (HOSPITAL_COMMUNITY)
Admission: RE | Disposition: A | Discharge status: Home / Self Care | Payer: Commercial Managed Care - PPO | Source: Home / Self Care | Attending: Cardiology

## 2018-10-19 DIAGNOSIS — I10 Essential (primary) hypertension: Secondary | ICD-10-CM | POA: Insufficient documentation

## 2018-10-19 DIAGNOSIS — Z881 Allergy status to other antibiotic agents status: Secondary | ICD-10-CM | POA: Diagnosis not present

## 2018-10-19 DIAGNOSIS — E119 Type 2 diabetes mellitus without complications: Secondary | ICD-10-CM | POA: Insufficient documentation

## 2018-10-19 DIAGNOSIS — E559 Vitamin D deficiency, unspecified: Secondary | ICD-10-CM | POA: Insufficient documentation

## 2018-10-19 DIAGNOSIS — I471 Supraventricular tachycardia: Secondary | ICD-10-CM | POA: Diagnosis present

## 2018-10-19 DIAGNOSIS — Z885 Allergy status to narcotic agent status: Secondary | ICD-10-CM | POA: Insufficient documentation

## 2018-10-19 DIAGNOSIS — Z8249 Family history of ischemic heart disease and other diseases of the circulatory system: Secondary | ICD-10-CM | POA: Diagnosis not present

## 2018-10-19 DIAGNOSIS — E7800 Pure hypercholesterolemia, unspecified: Secondary | ICD-10-CM | POA: Diagnosis not present

## 2018-10-19 HISTORY — PX: SVT ABLATION: EP1225

## 2018-10-19 LAB — BASIC METABOLIC PANEL WITH GFR
Anion gap: 9 (ref 5–15)
BUN: 12 mg/dL (ref 6–20)
CO2: 25 mmol/L (ref 22–32)
Calcium: 8.9 mg/dL (ref 8.9–10.3)
Chloride: 105 mmol/L (ref 98–111)
Creatinine, Ser: 0.61 mg/dL (ref 0.44–1.00)
GFR calc Af Amer: >60 mL/min
GFR calc non Af Amer: >60 mL/min
Glucose, Bld: 159 mg/dL — ABNORMAL HIGH (ref 70–99)
Potassium: 4 mmol/L (ref 3.5–5.1)
Sodium: 139 mmol/L (ref 135–145)

## 2018-10-19 LAB — CBC
HCT: 44.8 % (ref 36.0–46.0)
Hemoglobin: 15.1 g/dL — ABNORMAL HIGH (ref 12.0–15.0)
MCH: 29.6 pg (ref 26.0–34.0)
MCHC: 33.7 g/dL (ref 30.0–36.0)
MCV: 87.8 fL (ref 80.0–100.0)
Platelets: 275 10*3/uL (ref 150–400)
RBC: 5.1 MIL/uL (ref 3.87–5.11)
RDW: 12.8 % (ref 11.5–15.5)
WBC: 7.6 10*3/uL (ref 4.0–10.5)
nRBC: 0 % (ref 0.0–0.2)

## 2018-10-19 LAB — GLUCOSE, CAPILLARY: Glucose-Capillary: 141 mg/dL — ABNORMAL HIGH (ref 70–99)

## 2018-10-19 SURGERY — SVT ABLATION
Anesthesia: General

## 2018-10-19 MED ORDER — BUPIVACAINE HCL (PF) 0.25 % IJ SOLN
INTRAMUSCULAR | Status: DC | PRN
  Administered 2018-10-19: 60 mL

## 2018-10-19 MED ORDER — ONDANSETRON HCL 4 MG/2ML IJ SOLN: 4.0000 mg | Freq: Four times a day (QID) | INTRAMUSCULAR | Status: DC | PRN

## 2018-10-19 MED ORDER — SODIUM CHLORIDE 0.9 % IV SOLN
INTRAVENOUS | Status: DC | PRN
  Administered 2018-10-19: 13:00:00 2 ug/min via INTRAVENOUS

## 2018-10-19 MED ORDER — FENTANYL CITRATE (PF) 100 MCG/2ML IJ SOLN
INTRAMUSCULAR | Status: DC | PRN
  Administered 2018-10-19 (×5): 25 ug via INTRAVENOUS

## 2018-10-19 MED ORDER — HEPARIN (PORCINE) IN NACL 1000-0.9 UT/500ML-% IV SOLN
INTRAVENOUS | Status: AC
  Filled 2018-10-19: qty 500

## 2018-10-19 MED ORDER — MIDAZOLAM HCL 5 MG/5ML IJ SOLN
INTRAMUSCULAR | Status: AC
  Filled 2018-10-19: qty 5

## 2018-10-19 MED ORDER — FENTANYL CITRATE (PF) 100 MCG/2ML IJ SOLN
INTRAMUSCULAR | Status: AC
  Filled 2018-10-19: qty 2

## 2018-10-19 MED ORDER — MIDAZOLAM HCL 5 MG/5ML IJ SOLN
INTRAMUSCULAR | Status: DC | PRN
  Administered 2018-10-19 (×6): 1 mg via INTRAVENOUS

## 2018-10-19 MED ORDER — SODIUM CHLORIDE 0.9 % IV SOLN
INTRAVENOUS | Status: DC
  Administered 2018-10-19: 10:00:00 via INTRAVENOUS

## 2018-10-19 MED ORDER — SODIUM CHLORIDE 0.9% FLUSH: 3.0000 mL | Freq: Two times a day (BID) | INTRAVENOUS | Status: DC

## 2018-10-19 MED ORDER — SODIUM CHLORIDE 0.9% FLUSH: 3.0000 mL | INTRAVENOUS | Status: DC | PRN

## 2018-10-19 MED ORDER — HEPARIN (PORCINE) IN NACL 1000-0.9 UT/500ML-% IV SOLN
INTRAVENOUS | Status: DC | PRN
  Administered 2018-10-19 (×2): 500 mL

## 2018-10-19 MED ORDER — BUPIVACAINE HCL (PF) 0.25 % IJ SOLN
INTRAMUSCULAR | Status: AC
  Filled 2018-10-19: qty 60

## 2018-10-19 MED ORDER — SODIUM CHLORIDE 0.9 % IV SOLN: 250.0000 mL | INTRAVENOUS | Status: DC | PRN

## 2018-10-19 MED ORDER — ISOPROTERENOL HCL 0.2 MG/ML IJ SOLN
INTRAMUSCULAR | Status: AC
  Filled 2018-10-19: qty 5

## 2018-10-19 MED ORDER — ACETAMINOPHEN 325 MG PO TABS: 650.0000 mg | ORAL_TABLET | ORAL | Status: DC | PRN

## 2018-10-19 SURGICAL SUPPLY — 10 items
CATH EZ STEER NAV 4MM D-F CUR (ABLATOR) ×2 IMPLANT
CATH JOSEPHSON QUAD-ALLRED 6FR (CATHETERS) ×4 IMPLANT
CATH WEBSTER BI DIR CS D-F CRV (CATHETERS) ×2 IMPLANT
PACK EP LATEX FREE (CUSTOM PROCEDURE TRAY) ×3
PACK EP LF (CUSTOM PROCEDURE TRAY) ×1 IMPLANT
PAD PRO RADIOLUCENT 2001M-C (PAD) ×3 IMPLANT
PATCH CARTO3 (PAD) ×2 IMPLANT
SHEATH PINNACLE 6F 10CM (SHEATH) ×4 IMPLANT
SHEATH PINNACLE 7F 10CM (SHEATH) ×2 IMPLANT
SHEATH PINNACLE 8F 10CM (SHEATH) ×2 IMPLANT

## 2018-10-19 NOTE — Progress Notes (Signed)
     6, 8 Fr R F/V and 6,8 Fr L F/Vsheaths were removed, and manual pressure was applied for 10 min. on each site. Both groins are soft and non tender. Sterile gauze was applied at the site.  Bed rest started at 1510 X 4 hr. Instructions were given to patient about her bed rest.    HR 68 NSR  BP 150/74  sPO2 97% on R/A

## 2018-10-19 NOTE — Discharge Instructions (Signed)
Femoral Site Care °This sheet gives you information about how to care for yourself after your procedure. Your health care provider may also give you more specific instructions. If you have problems or questions, contact your health care provider. °What can I expect after the procedure? °After the procedure, it is common to have: °· Bruising that usually fades within 1-2 weeks. °· Tenderness at the site. °Follow these instructions at home: °Wound care °· Follow instructions from your health care provider about how to take care of your insertion site. Make sure you: °? Wash your hands with soap and water before you change your bandage (dressing). If soap and water are not available, use hand sanitizer. °? Change your dressing as told by your health care provider. °? Leave stitches (sutures), skin glue, or adhesive strips in place. These skin closures may need to stay in place for 2 weeks or longer. If adhesive strip edges start to loosen and curl up, you may trim the loose edges. Do not remove adhesive strips completely unless your health care provider tells you to do that. °· Do not take baths, swim, or use a hot tub until your health care provider approves. °· You may shower 24-48 hours after the procedure or as told by your health care provider. °? Gently wash the site with plain soap and water. °? Pat the area dry with a clean towel. °? Do not rub the site. This may cause bleeding. °· Do not apply powder or lotion to the site. Keep the site clean and dry. °· Check your femoral site every day for signs of infection. Check for: °? Redness, swelling, or pain. °? Fluid or blood. °? Warmth. °? Pus or a bad smell. °Activity °· For the first 2-3 days after your procedure, or as long as directed: °? Avoid climbing stairs as much as possible. °? Do not squat. °· Do not lift anything that is heavier than 10 lb (4.5 kg), or the limit that you are told, until your health care provider says that it is safe. °· Rest as  directed. °? Avoid sitting for a long time without moving. Get up to take short walks every 1-2 hours. °· Do not drive for 24 hours if you were given a medicine to help you relax (sedative). °General instructions °· Take over-the-counter and prescription medicines only as told by your health care provider. °· Keep all follow-up visits as told by your health care provider. This is important. °Contact a health care provider if you have: °· A fever or chills. °· You have redness, swelling, or pain around your insertion site. °Get help right away if: °· The catheter insertion area swells very fast. °· You pass out. °· You suddenly start to sweat or your skin gets clammy. °· The catheter insertion area is bleeding, and the bleeding does not stop when you hold steady pressure on the area. °· The area near or just beyond the catheter insertion site becomes pale, cool, tingly, or numb. °These symptoms may represent a serious problem that is an emergency. Do not wait to see if the symptoms will go away. Get medical help right away. Call your local emergency services (911 in the [place] Do not drive yourself to the hospital. °Summary °· After the procedure, it is common to have bruising that usually fades within 1-2 weeks. °· Check your femoral site every day for signs of infection. °· Do not lift anything that is heavier than 10 lb (4.5 kg), or the   limit that you are told, until your health care provider says that it is safe. This information is not intended to replace advice given to you by your health care provider. Make sure you discuss any questions you have with your health care provider. Document Released: 12/21/2013 Document Revised: 05/02/2017 Document Reviewed: 05/02/2017 Elsevier Interactive Patient Education  2019 [place] for patient to have massage 1 week post ablation procedure.

## 2018-10-19 NOTE — Progress Notes (Signed)
When client got up from bed, c/o spasm on left side of back and states has had this before, advised client I would call Dr Curt Bears about this and client states "no I have had this before"

## 2018-10-19 NOTE — Progress Notes (Signed)
Client up and walked and tolerated well; bilat groins stable, no bleeding or hematoma

## 2018-10-19 NOTE — H&P (Signed)
Electrophysiology TeleHealth Note   Due to national recommendations of social distancing due to COVID 19, an audio/video telehealth visit is felt to be most appropriate for this patient at this time.  See Epic message for the patient's consent to telehealth for [facility].   Date:  10/19/2018   ID:  Dana Townsend, DOB 02-02-1961, MRN [id number]  Location: patient's home  Provider location: [address] [place]  Evaluation Performed: Follow-up visit  PCP:  Shirline Frees, MD  Cardiologist:  Avielle Imbert Meredith Leeds, MD  Electrophysiologist:  Dr Curt Bears  Chief Complaint:  SVT  History of Present Illness:    Dana Townsend is a 58 y.o. female who presents via audio/video conferencing for a telehealth visit today.  Since last being seen in our clinic, the patient reports doing very well.  Today, she denies symptoms of palpitations, chest pain, shortness of breath,  lower extremity edema, dizziness, presyncope, or syncope.  The patient is otherwise without complaint today.  The patient denies symptoms of fevers, chills, cough, or new SOB worrisome for COVID 19.  She has a history of diabetes, hyperlipidemia, vitamin D deficiency, and SVT.  She recently presented to the hospital at [facility] with an episode of SVT.  The SVT was a long RP tachycardia.  She received adenosine and converted to sinus rhythm.  Her troponin rose to 1.28.  She had an echo that showed an ejection fraction of 55 to 60% without regional wall motion abnormalities.  Her elevated troponin was thought to be demand ischemia in the setting of heart rate of 216 for 2 hours.  Today, denies symptoms of palpitations, chest pain, shortness of breath, orthopnea, PND, lower extremity edema, claudication, dizziness, presyncope, syncope, bleeding, or neurologic sequela. The patient is tolerating medications without difficulties. Plan for ablation today.   Past Medical History:  Diagnosis Date   Cancer  ([place])    Complication of anesthesia    Difficult to arouse   Diabetes mellitus type II, controlled, with no complications ([place])    Dyspepsia    Dysrhythmia    runs SVT/VT   WITH ANXIETY      Hypertension    Kidney stones    Menopausal symptoms    Pure hypercholesterolemia    Supraventricular tachycardia ([place])    Vitamin D deficiency     Past Surgical History:  Procedure Laterality Date   CESAREAN SECTION     CHOLECYSTECTOMY N/A 04/28/2016   Procedure: LAPAROSCOPIC CHOLECYSTECTOMY WITH INTRAOPERATIVE CHOLANGIOGRAM;  Surgeon: Excell Seltzer, MD;  Location: [facility];  Service: General;  Laterality: N/A;   GANGLION CYST EXCISION     HISTORY KIDNEY STONES     IR URETERAL STENT PLACEMENT EXISTING ACCESS LEFT  06/22/2018   KIDNEY STONE SURGERY     left and right   LAPAROSCOPY     MOHS SURGERY     FACE   NEPHROLITHOTOMY Left 06/22/2018   Procedure: NEPHROLITHOTOMY PERCUTANEOUS;  Surgeon: Franchot Gallo, MD;  [facility];  Service: Urology;  Laterality: Left;  3 HRS    Current Facility-Administered Medications  Medication Dose Route Frequency Provider Last Rate Last Dose   0.9 %  sodium chloride infusion   Intravenous Continuous Constance Haw, MD 50 mL/hr at 10/19/18 4166      Allergies:   Epinephrine, Erythromycin base, and Dilaudid [hydromorphone hcl]   Social History:  The patient  reports that she has never smoked. She has never used smokeless tobacco. She reports that  she does not drink alcohol or use drugs.   Family History:  The patient's  family history includes Adrenal disorder in her mother; Alzheimer's disease in her father; Hypertension in her father; Kidney cancer in her father.   ROS:  Please see the history of present illness.   All other systems are personally reviewed and negative.    Exam:    Vital Signs:  BP (!) 154/85    Pulse 69    Temp 98.1 F (36.7 C)    Resp 16    Ht 5\' 3"  (1.6 m)    Wt 108 kg    SpO2 98%    BMI 42.16  kg/m   GEN: Well nourished, well developed, in no acute distress  HEENT: normal  Neck: no JVD, carotid bruits, or masses Cardiac: RRR; no murmurs, rubs, or gallops,no edema  Respiratory:  clear to auscultation bilaterally, normal work of breathing GI: soft, nontender, nondistended, + BS MS: no deformity or atrophy  Skin: warm and dry Neuro:  Strength and sensation are intact Psych: euthymic mood, full affect   Labs/Other Tests and Data Reviewed:    Recent Labs: 10/19/2018: BUN 12; Creatinine, Ser 0.61; Hemoglobin 15.1; Platelets 275; Potassium 4.0; Sodium 139   Wt Readings from Last 3 Encounters:  10/19/18 108 kg  06/22/18 106 kg  06/19/18 103 kg     Other studies personally reviewed: Additional studies/ records that were reviewed today include: Cardiac monitor 06/22/2017 personally reviewed  Review of the above records today demonstrates:   Sinus rhythm Palpitations associated with SVT appearing atrial tachycardia  ECG 06/19/2018 personally reviewed Sinus rhythm     ASSESSMENT & PLAN:    1.  SVT:   Dana Townsend has presented today for surgery, with the diagnosis of svt.  The various methods of treatment have been discussed with the patient and family. After consideration of risks, benefits and other options for treatment, the patient has consented to  Procedure(s): Catheter ablation as a surgical intervention .  Risks include but not limited to bleeding, tamponade, heart block, stroke, damage to surrounding organs, among others. The patient's history has been reviewed, patient examined, no change in status, stable for surgery.  I have reviewed the patient's chart and labs.  Questions were answered to the patient's satisfaction.      Signed, Alitzel Cookson Meredith Leeds, MD  10/19/2018 11:27 AM     [facility] [address] [place] [address] [phone] (office) [phone] (fax)

## 2018-10-19 NOTE — Anesthesia Preprocedure Evaluation (Signed)
Anesthesia Evaluation  Patient identified by MRN, date of birth, ID band Patient awake    Reviewed: Allergy & Precautions, H&P , NPO status , Patient's Chart, lab work & pertinent test results, reviewed documented beta blocker date and time   History of Anesthesia Complications (+) PROLONGED EMERGENCE and history of anesthetic complications  Airway Mallampati: II  TM Distance: >3 FB Neck ROM: full    Dental no notable dental hx.    Pulmonary neg pulmonary ROS,    Pulmonary exam normal breath sounds clear to auscultation       Cardiovascular Exercise Tolerance: Good hypertension, Pt. on medications and Pt. on home beta blockers + dysrhythmias Supra Ventricular Tachycardia  Rhythm:regular Rate:Normal  Pt seen by cardio electrophysiology, Dr. Reggy Eye, on 06/22/17 due to palpitations and presyncope.  30 day event monitor with sinus rhythm.  Palpitations associated with SVT appearing atrial tachycardia.  Asymptomatic at this visit with 1 year follow up recommended.     Neuro/Psych negative neurological ROS  negative psych ROS   GI/Hepatic negative GI ROS, Neg liver ROS,   Endo/Other  diabetes, Type 2  Renal/GU negative Renal ROS  negative genitourinary   Musculoskeletal   Abdominal   Peds  Hematology negative hematology ROS (+)   Anesthesia Other Findings 58 yo never smoker with h/o DM II, HTN, SVT/VT with anxiety, reports difficult to arouse with previous anesthesia, left renal stone scheduled for above procedure 06/22/18 with Dr. Franchot Gallo.    Reproductive/Obstetrics negative OB ROS                             Anesthesia Physical  Anesthesia Plan  ASA: III  Anesthesia Plan: General   Post-op Pain Management:    Induction: Intravenous  PONV Risk Score and Plan: 3 and Ondansetron, Dexamethasone, Treatment may vary due to age or medical condition and Midazolam  Airway  Management Planned: Oral ETT  Additional Equipment:   Intra-op Plan:   Post-operative Plan:   Informed Consent: I have reviewed the patients History and Physical, chart, labs and discussed the procedure including the risks, benefits and alternatives for the proposed anesthesia with the patient or authorized representative who has indicated his/her understanding and acceptance.     Dental Advisory Given  Plan Discussed with: CRNA, Anesthesiologist and Surgeon  Anesthesia Plan Comments: (See PAT note 06/19/2018, Konrad Felix, PA-C)        Anesthesia Quick Evaluation

## 2018-10-20 ENCOUNTER — Encounter (HOSPITAL_COMMUNITY): Payer: Self-pay | Admitting: Cardiology

## 2018-12-03 ENCOUNTER — Other Ambulatory Visit: Payer: Self-pay | Admitting: Cardiology

## 2018-12-12 ENCOUNTER — Encounter: Payer: Self-pay | Admitting: Cardiology

## 2018-12-12 ENCOUNTER — Ambulatory Visit (INDEPENDENT_AMBULATORY_CARE_PROVIDER_SITE_OTHER): Payer: Commercial Managed Care - PPO | Admitting: Cardiology

## 2018-12-12 ENCOUNTER — Other Ambulatory Visit: Payer: Self-pay

## 2018-12-12 VITALS — BP 132/76 | HR 67 | Ht 63.0 in | Wt 236.0 lb

## 2018-12-12 DIAGNOSIS — I471 Supraventricular tachycardia: Secondary | ICD-10-CM | POA: Diagnosis not present

## 2018-12-12 NOTE — Patient Instructions (Signed)
Medication Instructions:  Your physician recommends that you continue on your current medications as directed. Please refer to the Current Medication list given to you today.  * If you need a refill on your cardiac medications before your next appointment, please call your pharmacy.   Labwork: None ordered  Testing/Procedures: None ordered  Follow-Up: Your physician wants you to follow-up in: 1 year with Dr. Camnitz.  You will receive a reminder letter in the mail two months in advance. If you don't receive a letter, please call our office to schedule the follow-up appointment.  Thank you for choosing [facility]!!   Sherri Price, RN [phone]        

## 2018-12-12 NOTE — Progress Notes (Signed)
Electrophysiology Office Note   Date:  12/12/2018   ID:  Dana Townsend, DOB October 29, 1960, MRN [id number]  PCP:  Shirline Frees, MD  Cardiologist:   Primary Electrophysiologist:  Avrey Flanagin Meredith Leeds, MD    No chief complaint on file.    History of Present Illness: Dana Townsend is a 58 y.o. female who is being seen today for the evaluation of presyncope at the request of Shirline Frees, MD. Presenting today for electrophysiology evaluation.  She has a history of diabetes, hyperlipidemia, vitamin D deficiency, and palpitations.  She possibly has SVT versus multifocal atrial tachycardia in the past as per chart review.  She has a history of near syncope.  She is also had 3 episodes of near syncope since Thanksgiving.  She said that she had to sit down and was quite dizzy.  She noted both tachycardia and bradycardia at the time.  She says that she has had SVT, PVCs, and possibly atrial fibrillation in the past.  During the episode on Thanksgiving day, she checked her pulse radially which was difficult to palpate.  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  Today, denies symptoms of palpitations, chest pain, shortness of breath, orthopnea, PND, lower extremity edema, claudication, dizziness, presyncope, syncope, bleeding, or neurologic sequela. The patient is tolerating medications without difficulties.  Overall she is doing well.  She has no chest pain or shortness of breath.  She is able to do all of her daily activities without restriction.  She has had no further episodes of SVT.  Past Medical History:  Diagnosis Date  . Cancer ([place])   . Complication of anesthesia    Difficult to arouse  . Diabetes mellitus type II, controlled, with no complications ([place])   . Dyspepsia   . Dysrhythmia    runs SVT/VT   WITH ANXIETY     . Hypertension   . Kidney stones   . Menopausal symptoms   . Pure hypercholesterolemia   . Supraventricular  tachycardia ([place])   . Vitamin D deficiency    Past Surgical History:  Procedure Laterality Date  . CESAREAN SECTION    . CHOLECYSTECTOMY N/A 04/28/2016   Procedure: LAPAROSCOPIC CHOLECYSTECTOMY WITH INTRAOPERATIVE CHOLANGIOGRAM;  Surgeon: Excell Seltzer, MD;  Location: [place];  Service: General;  Laterality: N/A;  . GANGLION CYST EXCISION    . HISTORY KIDNEY STONES    . IR URETERAL STENT PLACEMENT EXISTING ACCESS LEFT  06/22/2018  . KIDNEY STONE SURGERY     left and right  . LAPAROSCOPY    . [place]  . NEPHROLITHOTOMY Left 06/22/2018   Procedure: NEPHROLITHOTOMY PERCUTANEOUS;  Surgeon: Franchot Gallo, MD;  [facility];  Service: Urology;  Laterality: Left;  3 HRS  . SVT ABLATION N/A 10/19/2018   Procedure: SVT ABLATION;  Surgeon: Constance Haw, MD;  Location: [place] [facility];  Service: Cardiovascular;  Laterality: N/A;     Current Outpatient Medications  Medication Sig Dispense Refill  . acetaminophen (TYLENOL) 500 MG tablet Take 1,000 mg by mouth every 6 (six) hours as needed (pain.).    Marland Kitchen Acetylcysteine (N-ACETYL-L-CYSTEINE) 600 MG CAPS Take 600 mg by mouth daily.     . Ascorbic Acid (VITAMIN C) 1000 MG tablet Take 1,000 mg by mouth daily.    . carvedilol (COREG) 12.5 MG tablet TAKE 1 TABLET (12.5 MG TOTAL) BY MOUTH 2 (TWO) TIMES DAILY. 180 tablet 1  .  Cholecalciferol (VITAMIN D-3) 125 MCG (5000 UT) TABS Take 5,000-10,000 Units by mouth daily.     . Coenzyme Q10 (COQ10 PO) Take 1 capsule by mouth daily with lunch.     . Multiple Minerals-Vitamins (CAL MAG ZINC +D3 PO) Take 15 mLs by mouth every evening.    . Multiple Vitamin (MULTIVITAMIN WITH MINERALS) TABS tablet Take 1 tablet by mouth daily.     . Multiple Vitamins-Minerals (LUTEIN-ZEAXANTHIN PO) Take 1 capsule by mouth daily.     . NON FORMULARY Take 1 capsule by mouth daily. Plexus Bio Cleanse   Bioflavonoid Complex  (quince, orange peel, lemon peel) - 50mg . Sodium - 50mg . Magnesium -  380mg . Ascorbic Acid (Vitamin C) - 150mg . Bioflavoniod Complex - 50mg .    . Omega-3 Fatty Acids (OMEGA 3 PO) Take 1 capsule by mouth daily. PLANT BASED     . OVER THE COUNTER MEDICATION Take 650 mg by mouth daily. Hydrangea Root    . Probiotic Product (PROBIOTIC PO) Take 1 capsule by mouth daily.     . vitamin E 400 UNIT capsule Take 400 Units by mouth daily.     . vitamin k 100 MCG tablet Take 100 mcg by mouth daily.    . Zinc 50 MG TABS Take 50 mg by mouth daily.     No current facility-administered medications for this visit.     Allergies:   Epinephrine, Erythromycin base, and Dilaudid [hydromorphone hcl]   Social History:  The patient  reports that she has never smoked. She has never used smokeless tobacco. She reports that she does not drink alcohol or use drugs.   Family History:  The patient's family history includes Adrenal disorder in her mother; Alzheimer's disease in her father; Hypertension in her father; Kidney cancer in her father.   ROS:  Please see the history of present illness.   Otherwise, review of systems is positive for none.   All other systems are reviewed and negative.   PHYSICAL EXAM: VS:  BP 132/76   Pulse 67   Ht 5\' 3"  (1.6 m)   Wt 236 lb (107 kg)   BMI 41.81 kg/m  , BMI Body mass index is 41.81 kg/m. GEN: Well nourished, well developed, in no acute distress  HEENT: normal  Neck: no JVD, carotid bruits, or masses Cardiac: RRR; no murmurs, rubs, or gallops,no edema  Respiratory:  clear to auscultation bilaterally, normal work of breathing GI: soft, nontender, nondistended, + BS MS: no deformity or atrophy  Skin: warm and dry Neuro:  Strength and sensation are intact Psych: euthymic mood, full affect  EKG:  EKG is ordered today. Personal review of the ekg ordered shows sinus rhythm   Recent Labs: 10/19/2018: BUN 12; Creatinine, Ser 0.61; Hemoglobin 15.1; Platelets 275; Potassium 4.0; Sodium 139    Lipid Panel  No results found for: CHOL, TRIG,  HDL, CHOLHDL, VLDL, LDLCALC, LDLDIRECT   Wt Readings from Last 3 Encounters:  12/12/18 236 lb (107 kg)  10/19/18 238 lb (108 kg)  06/22/18 233 lb 11 oz (106 kg)      Other studies Reviewed: Additional studies/ records that were reviewed today include: 30 day monitor 06/22/17 - personally reviewed Sinus rhythm Palpitations associated with SVT appearing atrial tachycardia  ASSESSMENT AND PLAN:  1.  PACs/atrial tachycardia: Found on her device interrogation.  She has had minimal symptoms since last being seen.  She is not taking her metoprolol in the last month.  She would like to stop this medication.  We Brinson Tozzi continue to follow without changes.  She Tracen Mahler call us back if she has further issues..    2.  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  No further episodes of AVNRT.  No changes.  3.  Hypertension: Blood pressure is normal today in clinic but is elevated at home.  She says that it is lower when she is consistently exercising.  She Tracee Mccreery try to increase her exercise regimen and see if this helps.  Current medicines are reviewed at length with the patient today.   The patient does not have concerns regarding her medicines.  The following changes were made today: None  Labs/ tests ordered today include:  Orders Placed This Encounter  Procedures  . EKG 12-Lead     Disposition:   FU with Egbert Seidel 1 year  Signed, Letzy Gullickson Meredith Leeds, MD  12/12/2018 3:44 PM     [facility] [address] [place] [address] [phone] (office) [phone] (fax)

## 2019-05-30 ENCOUNTER — Other Ambulatory Visit: Payer: Self-pay | Admitting: Cardiology

## 2019-11-24 ENCOUNTER — Other Ambulatory Visit: Payer: Self-pay | Admitting: Cardiology

## 2020-01-10 ENCOUNTER — Encounter: Payer: Self-pay | Admitting: Cardiology

## 2020-01-10 ENCOUNTER — Ambulatory Visit (INDEPENDENT_AMBULATORY_CARE_PROVIDER_SITE_OTHER): Payer: BC Managed Care – PPO | Admitting: Cardiology

## 2020-01-10 ENCOUNTER — Other Ambulatory Visit: Payer: Self-pay

## 2020-01-10 VITALS — BP 126/86 | HR 57 | Ht 63.0 in | Wt 204.2 lb

## 2020-01-10 DIAGNOSIS — I471 Supraventricular tachycardia: Secondary | ICD-10-CM

## 2020-01-10 NOTE — Progress Notes (Signed)
Electrophysiology Office Note   Date:  01/10/2020   ID:  Dana Townsend, DOB 1961-01-12, MRN [id number]  PCP:  Dana Frees, MD  Cardiologist:   Primary Electrophysiologist:  Dana Fatica Meredith Leeds, MD    No chief complaint on file.    History of Present Illness: Dana Townsend is a 59 y.o. female who is being seen today for the evaluation of presyncope at the request of Dana Frees, MD. Presenting today for electrophysiology evaluation.  She has a history of diabetes, hyperlipidemia, vitamin D deficiency, and palpitations.  She possibly has SVT versus multifocal atrial tachycardia in the past as per chart review.  She has a history of near syncope.  She is also had 3 episodes of near syncope since Thanksgiving.  She said that she had to sit down and was quite dizzy.  She noted both tachycardia and bradycardia at the time.  She says that she has had SVT, PVCs, and possibly atrial fibrillation in the past.  During the episode on Thanksgiving day, she checked her pulse radially which was difficult to palpate.  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  Today, denies symptoms of palpitations, chest pain, shortness of breath, orthopnea, PND, lower extremity edema, claudication, dizziness, presyncope, syncope, bleeding, or neurologic sequela. The patient is tolerating medications without difficulties.  Since last being seen, she had coronavirus in November.  For the 2 months thereafter, she had palpitations mainly at night.  These are self resolving.  She has had no further palpitations since then.  She has not had a coronavirus vaccine.  We had a long discussion on the benefits of vaccination even though she had that the virus.  She remains hesitant.  Past Medical History:  Diagnosis Date  . Cancer ([place])   . Complication of anesthesia    Difficult to arouse  . Diabetes mellitus type II, controlled, with no complications ([place])   .  Dyspepsia   . Dysrhythmia    runs SVT/VT   WITH ANXIETY     . Hypertension   . Kidney stones   . Menopausal symptoms   . Pure hypercholesterolemia   . Supraventricular tachycardia ([place])   . Vitamin D deficiency    Past Surgical History:  Procedure Laterality Date  . CESAREAN SECTION    . CHOLECYSTECTOMY N/A 04/28/2016   Procedure: LAPAROSCOPIC CHOLECYSTECTOMY WITH INTRAOPERATIVE CHOLANGIOGRAM;  Surgeon: Dana Seltzer, MD;  Location: [place];  Service: General;  Laterality: N/A;  . GANGLION CYST EXCISION    . HISTORY KIDNEY STONES    . IR URETERAL STENT PLACEMENT EXISTING ACCESS LEFT  06/22/2018  . KIDNEY STONE SURGERY     left and right  . LAPAROSCOPY    . [place]  . NEPHROLITHOTOMY Left 06/22/2018   Procedure: NEPHROLITHOTOMY PERCUTANEOUS;  Surgeon: Dana Gallo, MD;  [facility];  Service: Urology;  Laterality: Left;  3 HRS  . SVT ABLATION N/A 10/19/2018   Procedure: SVT ABLATION;  Surgeon: Dana Haw, MD;  Location: [place] [facility];  Service: Cardiovascular;  Laterality: N/A;     Current Outpatient Medications  Medication Sig Dispense Refill  . acetaminophen (TYLENOL) 500 MG tablet Take 1,000 mg by mouth every 6 (six) hours as needed (pain.).    Marland Kitchen Acetylcysteine (N-ACETYL-L-CYSTEINE) 600 MG CAPS Take 600 mg by mouth daily.     . Ascorbic Acid (VITAMIN C) 1000 MG tablet Take 1,000 mg by  mouth daily.    . carvedilol (COREG) 6.25 MG tablet Take 6.25 mg by mouth 2 (two) times daily with a meal.    . Cholecalciferol (VITAMIN D-3) 125 MCG (5000 UT) TABS Take 5,000-10,000 Units by mouth daily.     . Multiple Vitamins-Minerals (LUTEIN-ZEAXANTHIN PO) Take 1 capsule by mouth daily.     . NON FORMULARY Take 1 capsule by mouth daily. Plexus Bio Cleanse   Bioflavonoid Complex  (quince, orange peel, lemon peel) - 50mg . Sodium - 50mg . Magnesium - 380mg . Ascorbic Acid (Vitamin C) - 150mg . Bioflavoniod Complex - 50mg .    . Omega-3 Fatty Acids (OMEGA  3 PO) Take 1 capsule by mouth daily. PLANT BASED     . OVER THE COUNTER MEDICATION Take 650 mg by mouth daily. Hydrangea Root    . Probiotic Product (PROBIOTIC PO) Take 1 capsule by mouth daily.     . Zinc 50 MG TABS Take 50 mg by mouth daily.     No current facility-administered medications for this visit.    Allergies:   Epinephrine, Erythromycin base, and Dilaudid [hydromorphone hcl]   Social History:  The patient  reports that she has never smoked. She has never used smokeless tobacco. She reports that she does not drink alcohol and does not use drugs.   Family History:  The patient's family history includes Adrenal disorder in her mother; Alzheimer's disease in her father; Hypertension in her father; Kidney cancer in her father.   ROS:  Please see the history of present illness.   Otherwise, review of systems is positive for none.   All other systems are reviewed and negative.   PHYSICAL EXAM: VS:  BP 126/86   Pulse (!) 57   Ht 5\' 3"  (1.6 m)   Wt 204 lb 3.2 oz (92.6 kg)   SpO2 95%   BMI 36.17 kg/m  , BMI Body mass index is 36.17 kg/m. GEN: Well nourished, well developed, in no acute distress  HEENT: normal  Neck: no JVD, carotid bruits, or masses Cardiac: RRR; no murmurs, rubs, or gallops,no edema  Respiratory:  clear to auscultation bilaterally, normal work of breathing GI: soft, nontender, nondistended, + BS MS: no deformity or atrophy  Skin: warm and dry Neuro:  Strength and sensation are intact Psych: euthymic mood, full affect  EKG:  EKG is ordered today. Personal review of the ekg ordered shows sinus rhythm  Recent Labs: No results found for requested labs within last 8760 hours.    Lipid Panel  No results found for: CHOL, TRIG, HDL, CHOLHDL, VLDL, LDLCALC, LDLDIRECT   Wt Readings from Last 3 Encounters:  01/10/20 204 lb 3.2 oz (92.6 kg)  12/12/18 236 lb (107 kg)  10/19/18 238 lb (108 kg)      Other studies Reviewed: Additional studies/ records that  were reviewed today include: 30 day monitor 06/22/17 - personally reviewed Sinus rhythm Palpitations associated with SVT appearing atrial tachycardia  ASSESSMENT AND PLAN:  1.  PACs/atrial tachycardia: Found on cardiac monitor.metoprolol was stopped at her last visit.  No recent palpitations.  She did have coronavirus in November and had 2 months worth of palpitations at that time.  They were self resolving.  2.  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  No obvious recurrences  3.  Hypertension: Well-controlled  Current medicines are reviewed at length with the patient today.   The patient does not have concerns regarding her medicines.  The following changes were made today: None  Labs/ tests ordered today include:  Orders Placed This Encounter  Procedures  . EKG 12-Lead     Disposition:   FU with Mirta Mally 1 year  Signed, Athony Coppa Meredith Leeds, MD  01/10/2020 11:58 AM     [facility] [address] [place] [address] [phone] (office) [phone] (fax)

## 2020-05-05 ENCOUNTER — Other Ambulatory Visit: Payer: Self-pay | Admitting: Cardiology

## 2020-05-06 ENCOUNTER — Other Ambulatory Visit: Payer: Self-pay

## 2020-05-06 MED ORDER — CARVEDILOL 6.25 MG PO TABS: 6.2500 mg | ORAL_TABLET | Freq: Two times a day (BID) | ORAL | 2 refills | Status: DC

## 2020-05-06 NOTE — Telephone Encounter (Signed)
Pt's medication was sent to pt's pharmacy as requested. Confirmation received.  °

## 2020-05-14 ENCOUNTER — Other Ambulatory Visit: Payer: Self-pay

## 2020-05-14 ENCOUNTER — Ambulatory Visit (INDEPENDENT_AMBULATORY_CARE_PROVIDER_SITE_OTHER): Payer: 59 | Admitting: Family Medicine

## 2020-05-14 ENCOUNTER — Encounter: Payer: Self-pay | Admitting: Family Medicine

## 2020-05-14 VITALS — BP 141/80 | HR 56 | Ht 63.0 in | Wt 193.2 lb

## 2020-05-14 DIAGNOSIS — E119 Type 2 diabetes mellitus without complications: Secondary | ICD-10-CM

## 2020-05-14 DIAGNOSIS — Z820 Family history of epilepsy and other diseases of the nervous system: Secondary | ICD-10-CM | POA: Diagnosis not present

## 2020-05-14 DIAGNOSIS — M25512 Pain in left shoulder: Secondary | ICD-10-CM

## 2020-05-14 DIAGNOSIS — I10 Essential (primary) hypertension: Secondary | ICD-10-CM | POA: Insufficient documentation

## 2020-05-14 DIAGNOSIS — G8929 Other chronic pain: Secondary | ICD-10-CM

## 2020-05-14 DIAGNOSIS — Z85828 Personal history of other malignant neoplasm of skin: Secondary | ICD-10-CM | POA: Insufficient documentation

## 2020-05-14 DIAGNOSIS — E559 Vitamin D deficiency, unspecified: Secondary | ICD-10-CM

## 2020-05-14 DIAGNOSIS — E785 Hyperlipidemia, unspecified: Secondary | ICD-10-CM | POA: Insufficient documentation

## 2020-05-14 HISTORY — DX: Type 2 diabetes mellitus without complications: E11.9

## 2020-05-14 NOTE — Progress Notes (Signed)
Office Visit Note   Patient: Dana Townsend           Date of Birth: 04-Mar-1961           MRN: [id number] Visit Date: 05/14/2020 Requested by: Shirline Frees, MD [place] [address] [place],  [place] [address] PCP: Eunice Blase, MD  Subjective: Chief Complaint  Patient presents with  . Other    Establish primary care H/o Alzheimer's in her family - interested in functional medicine and how she can keep from getting the disease    HPI: She is here to establish care.  She has not had a PCP in a while.  She found me through a functional medicine website.  She has a history of diabetes which resolved with low carbohydrate eating and significant weight loss.  She has lost about 50 pounds this past year and plans to lose about 40 more.  Her most recent A1c was around 5.  She is no longer on metformin.  She has hypertension which has been well controlled.  She does not require medication for this.  She has a family history of Alzheimer's and a couple family members.  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  She would like some additional testing to be sure she is doing everything she can to keep from developing it.  She has a history of basal cell cancer on her face.  She has another lesion on the right side of her face that has become more pigmented and she wanted my opinion.  She plans to see a dermatologist again soon.  She has a history of vitamin D deficiency which resolved with supplementation.  She has not had her levels checked in a while.  She also has a history of copper/zinc imbalance.  She has been taking zinc this past year to correct that.  Her left shoulder has bothered her for several years.  She gets a popping sensation intermittently.  She gets pain in the shoulder blade region as well.                ROS:   All other systems were reviewed and are negative.  Objective: Vital Signs: BP (!) 141/80    Pulse (!) 56   Ht 5\' 3"  (1.6 m)   Wt 193 lb 3.2 oz (87.6 kg)   BMI 34.22 kg/m   Physical Exam:  General:  Alert and oriented, in no acute distress. Pulm:  Breathing unlabored. Psy:  Normal mood, congruent affect. Skin: There is a mostly flesh-colored raised lesion on her left cheek with a pigmented area on the superior aspect. Neck: No thyromegaly or nodules.  No carotid bruits. CV: Regular rate and rhythm without murmurs, rubs, or gallops.  No peripheral edema.  2+ radial and posterior tibial pulses. Lungs: Clear to auscultation throughout with no wheezing or areas of consolidation. Left shoulder: Full range of motion except for external rotation which is slightly limited.  She has pain at the extremes.  Rotator cuff strength is 5/5.  It does not hurt much with strength testing.  She has tender rhomboid trigger points.    Imaging: No results found.  Assessment & Plan: 1. history of diabetes, resolved with lifestyle change. -Recheck labs today.  2.  Hypertension, controlled without medication.  3.  Family history of Alzheimer's disease -We will check some additional labs.  4.  Chronic left shoulder pain probably due to  impingement and myofascial pain - Physical therapy at [facility].  5. D deficiency -Recheck levels.     Procedures: No procedures performed        PMFS History: Patient Active Problem List   Diagnosis Date Noted  . Family history of Alzheimer's disease 05/14/2020  . Diabetes ([place]) 05/14/2020  . Hypertension 05/14/2020  . Hyperlipidemia 05/14/2020  . Vitamin D deficiency 05/14/2020  . History of basal cell cancer 05/14/2020  . Nephrolithiasis 06/22/2018  . Cholecystitis 04/28/2016   Past Medical History:  Diagnosis Date  . Cancer ([place])   . Complication of anesthesia    Difficult to arouse  . Diabetes ([place]) 05/14/2020  . Diabetes mellitus type II, controlled, with no complications ([place])   . Dyspepsia   . Dysrhythmia    runs SVT/VT   WITH  ANXIETY     . Hypertension   . Kidney stones   . Menopausal symptoms   . Pure hypercholesterolemia   . Supraventricular tachycardia ([place])   . Vitamin D deficiency     Family History  Problem Relation Age of Onset  . Adrenal disorder Mother   . Hypertension Father   . Alzheimer's disease Father   . Kidney cancer Father   . Arthritis Father   . Cancer Father   . Arthritis Paternal Grandfather   . Hypertension Paternal Grandfather   . Stroke Paternal Grandfather   . Alzheimer's disease Paternal Grandfather   . Alcohol abuse Maternal Grandfather   . Heart attack Maternal Grandmother     Past Surgical History:  Procedure Laterality Date  . CESAREAN SECTION    . CHOLECYSTECTOMY N/A 04/28/2016   Procedure: LAPAROSCOPIC CHOLECYSTECTOMY WITH INTRAOPERATIVE CHOLANGIOGRAM;  Surgeon: Excell Seltzer, MD;  Location: [facility];  Service: General;  Laterality: N/A;  . GANGLION CYST EXCISION    . HISTORY KIDNEY STONES    . IR URETERAL STENT PLACEMENT EXISTING ACCESS LEFT  06/22/2018  . KIDNEY STONE SURGERY     left and right  . LAPAROSCOPY    . [place]  . NEPHROLITHOTOMY Left 06/22/2018   Procedure: NEPHROLITHOTOMY PERCUTANEOUS;  Surgeon: Franchot Gallo, MD;  [facility];  Service: Urology;  Laterality: Left;  3 HRS  . SVT ABLATION N/A 10/19/2018   Procedure: SVT ABLATION;  Surgeon: Constance Haw, MD;  Location: [place] [facility] [facility];  Service: Cardiovascular;  Laterality: N/A;   Social History   Occupational History  . Not on file  Tobacco Use  . Smoking status: Never Smoker  . Smokeless tobacco: Never Used  Vaping Use  . Vaping Use: Never used  Substance and Sexual Activity  . Alcohol use: No  . Drug use: No  . Sexual activity: Not on file

## 2020-05-15 ENCOUNTER — Telehealth: Payer: Self-pay

## 2020-05-15 DIAGNOSIS — G8929 Other chronic pain: Secondary | ICD-10-CM

## 2020-05-15 DIAGNOSIS — M25512 Pain in left shoulder: Secondary | ICD-10-CM

## 2020-05-15 NOTE — Telephone Encounter (Signed)
Orders placed.

## 2020-05-15 NOTE — Telephone Encounter (Signed)
I called and advised the patient she should be receiving a call to set up an appointment at one of the [facility] PT facilities.

## 2020-05-15 NOTE — Addendum Note (Signed)
Addended by: Hortencia Pilar on: 05/15/2020 11:19 AM   Modules accepted: Orders

## 2020-05-15 NOTE — Telephone Encounter (Signed)
Patient called she stated she was referred to pt that was OON with her insurance she is requesting a referral to be sent somewhere that accepts her insurance. [phone]

## 2020-05-15 NOTE — Telephone Encounter (Signed)
The [facility] facilities take [facility].

## 2020-05-16 ENCOUNTER — Telehealth: Payer: Self-pay | Admitting: Family Medicine

## 2020-05-16 DIAGNOSIS — G8929 Other chronic pain: Secondary | ICD-10-CM

## 2020-05-16 DIAGNOSIS — M25512 Pain in left shoulder: Secondary | ICD-10-CM

## 2020-05-16 LAB — TOTAL GLUTATHIONE: Total Glutathione: 302 ug/mL (ref 176–323)

## 2020-05-16 NOTE — Telephone Encounter (Signed)
Glutathione level looks good.

## 2020-05-19 ENCOUNTER — Ambulatory Visit: Payer: BC Managed Care – PPO | Admitting: Physician Assistant

## 2020-05-20 ENCOUNTER — Telehealth: Payer: Self-pay | Admitting: Family Medicine

## 2020-05-20 ENCOUNTER — Encounter: Payer: Self-pay | Admitting: Family Medicine

## 2020-05-20 LAB — CBC WITH DIFFERENTIAL/PLATELET
Absolute Monocytes: 422 cells/uL (ref 200–950)
Basophils Absolute: 38 cells/uL (ref 0–200)
Basophils Relative: 0.6 %
Eosinophils Absolute: 122 cells/uL (ref 15–500)
Eosinophils Relative: 1.9 %
HCT: 43.6 % (ref 35.0–45.0)
Hemoglobin: 14.6 g/dL (ref 11.7–15.5)
Lymphs Abs: 2989 cells/uL (ref 850–3900)
MCH: 30 pg (ref 27.0–33.0)
MCHC: 33.5 g/dL (ref 32.0–36.0)
MCV: 89.5 fL (ref 80.0–100.0)
MPV: 10 fL (ref 7.5–12.5)
Monocytes Relative: 6.6 %
Neutro Abs: 2829 cells/uL (ref 1500–7800)
Neutrophils Relative %: 44.2 %
Platelets: 296 10*3/uL (ref 140–400)
RBC: 4.87 10*6/uL (ref 3.80–5.10)
RDW: 12.8 % (ref 11.0–15.0)
Total Lymphocyte: 46.7 %
WBC: 6.4 10*3/uL (ref 3.8–10.8)

## 2020-05-20 LAB — HIGH SENSITIVITY CRP: hs-CRP: 2.4 mg/L

## 2020-05-20 LAB — COMPREHENSIVE METABOLIC PANEL
AG Ratio: 1.7 (calc) (ref 1.0–2.5)
ALT: 20 U/L (ref 6–29)
AST: 17 U/L (ref 10–35)
Albumin: 4 g/dL (ref 3.6–5.1)
Alkaline phosphatase (APISO): 63 U/L (ref 37–153)
BUN: 11 mg/dL (ref 7–25)
CO2: 30 mmol/L (ref 20–32)
Calcium: 9.3 mg/dL (ref 8.6–10.4)
Chloride: 103 mmol/L (ref 98–110)
Creat: 0.69 mg/dL (ref 0.50–0.99)
Globulin: 2.4 g/dL (calc) (ref 1.9–3.7)
Glucose, Bld: 91 mg/dL (ref 65–99)
Potassium: 4.2 mmol/L (ref 3.5–5.3)
Sodium: 140 mmol/L (ref 135–146)
Total Bilirubin: 0.8 mg/dL (ref 0.2–1.2)
Total Protein: 6.4 g/dL (ref 6.1–8.1)

## 2020-05-20 LAB — LIPID PANEL
Cholesterol: 239 mg/dL — ABNORMAL HIGH (ref <200)
HDL: 43 mg/dL — ABNORMAL LOW (ref >=50)
LDL Cholesterol (Calc): 172 mg/dL (calc) — ABNORMAL HIGH
Non-HDL Cholesterol (Calc): 196 mg/dL (calc) — ABNORMAL HIGH (ref <130)
Total CHOL/HDL Ratio: 5.6 (calc) — ABNORMAL HIGH (ref <5.0)
Triglycerides: 115 mg/dL (ref <150)

## 2020-05-20 LAB — THYROID PANEL WITH TSH
Free Thyroxine Index: 2.5 (ref 1.4–3.8)
T3 Uptake: 34 % (ref 22–35)
T4, Total: 7.3 ug/dL (ref 5.1–11.9)
TSH: 1.52 mIU/L (ref 0.40–4.50)

## 2020-05-20 LAB — ZINC: Zinc: 65 ug/dL (ref 60–130)

## 2020-05-20 LAB — VITAMIN D 25 HYDROXY (VIT D DEFICIENCY, FRACTURES): Vit D, 25-Hydroxy: 65 ng/mL (ref 30–100)

## 2020-05-20 LAB — SEDIMENTATION RATE: Sed Rate: 2 mm/h (ref 0–30)

## 2020-05-20 LAB — HEMOGLOBIN A1C
Hgb A1c MFr Bld: 5.8 % of total Hgb — ABNORMAL HIGH (ref <5.7)
Mean Plasma Glucose: 120 mg/dL
eAG (mmol/L): 6.6 mmol/L

## 2020-05-20 LAB — OMEGA3/6 FATTY, ACID
ARACHIDONIC ACID: 8.1 % (ref 5.2–12.9)
DHA: 3.5 % (ref 1.2–3.9)
EPA/ARACHIDONIC ACID RATIO: 0.3 — ABNORMAL HIGH
EPA: 2.6 % — ABNORMAL HIGH (ref 0.2–1.5)
OMEGA 3 (EPA+DHA) INDEX: 6.1 % — ABNORMAL HIGH (ref 1.4–4.9)
OMEGA 6/OMEGA 3 RATIO: 3.3 — ABNORMAL LOW (ref 5.7–21.3)
RISK: LOW

## 2020-05-20 LAB — COPPER, SERUM: Copper: 101 ug/dL (ref 70–175)

## 2020-05-20 LAB — VITAMIN B12: Vitamin B-12: 1954 pg/mL — ABNORMAL HIGH (ref 200–1100)

## 2020-05-20 LAB — HOMOCYSTEINE: Homocysteine: 6.9 umol/L (ref <10.4)

## 2020-05-20 LAB — VITAMIN B2(RIBOFLAVIN),PLASMA: Vitamin B2(Riboflavin),Plasma: 22.8 nmol/L (ref 6.2–39.0)

## 2020-05-20 LAB — INSULIN, RANDOM: Insulin: 3 u[IU]/mL

## 2020-05-20 NOTE — Addendum Note (Signed)
Addended by: Hortencia Pilar on: 05/20/2020 09:49 AM   Modules accepted: Orders

## 2020-05-20 NOTE — Telephone Encounter (Signed)
Patient is aware of lab results.  In addition, copper/zinc ratio is too high.  Will add zinc.

## 2020-05-21 ENCOUNTER — Ambulatory Visit (INDEPENDENT_AMBULATORY_CARE_PROVIDER_SITE_OTHER): Payer: 59 | Admitting: Physical Therapy

## 2020-05-21 ENCOUNTER — Encounter: Payer: Self-pay | Admitting: Physical Therapy

## 2020-05-21 ENCOUNTER — Other Ambulatory Visit: Payer: Self-pay

## 2020-05-21 DIAGNOSIS — G8929 Other chronic pain: Secondary | ICD-10-CM | POA: Diagnosis not present

## 2020-05-21 DIAGNOSIS — M6281 Muscle weakness (generalized): Secondary | ICD-10-CM

## 2020-05-21 DIAGNOSIS — M25612 Stiffness of left shoulder, not elsewhere classified: Secondary | ICD-10-CM

## 2020-05-21 DIAGNOSIS — M25512 Pain in left shoulder: Secondary | ICD-10-CM

## 2020-05-21 NOTE — Therapy (Signed)
[facility] Physical Therapy [address] [place], [place], [address] Phone: [phone]   Fax:  [phone]  Physical Therapy Evaluation  During this treatment session, this physical therapist was present, participating in and directing the treatment.   This note has been reviewed and this clinician agrees with the information provided.   Elsie Ra, PT, DPT 05/21/20 3:15 PM    Patient Details  Name: BRYNLYNN WALKO MRN: [id number] Date of Birth: [birth year] Referring Provider (PT): Eunice Blase, MD   Encounter Date: 05/21/2020   PT End of Session - 05/21/20 1402    Visit Number 1    Number of Visits 14    Date for PT Re-Evaluation 07/16/20    PT Start Time 1300    PT Stop Time 1350    PT Time Calculation (min) 50 min    Activity Tolerance Patient tolerated treatment well;No increased pain    Behavior During Therapy WFL for tasks assessed/performed           Past Medical History:  Diagnosis Date  . Cancer ([place])   . Complication of anesthesia    Difficult to arouse  . Diabetes ([place]) 05/14/2020  . Diabetes mellitus type II, controlled, with no complications ([place])   . Dyspepsia   . Dysrhythmia    runs SVT/VT   WITH ANXIETY     . Hypertension   . Kidney stones   . Menopausal symptoms   . Pure hypercholesterolemia   . Supraventricular tachycardia ([place])   . Vitamin D deficiency     Past Surgical History:  Procedure Laterality Date  . CESAREAN SECTION    . CHOLECYSTECTOMY N/A 04/28/2016   Procedure: LAPAROSCOPIC CHOLECYSTECTOMY WITH INTRAOPERATIVE CHOLANGIOGRAM;  Surgeon: Excell Seltzer, MD;  Location: [place];  Service: General;  Laterality: N/A;  . GANGLION CYST EXCISION    . HISTORY KIDNEY STONES    . IR URETERAL STENT PLACEMENT EXISTING ACCESS LEFT  06/22/2018  . KIDNEY STONE SURGERY     left and right  . LAPAROSCOPY    . [place]  . NEPHROLITHOTOMY Left 06/22/2018   Procedure: NEPHROLITHOTOMY PERCUTANEOUS;  Surgeon:  Franchot Gallo, MD;  Location[facility];  Service: Urology;  Laterality: Left;  3 HRS  . SVT ABLATION N/A 10/19/2018   Procedure: SVT ABLATION;  Surgeon: Constance Haw, MD;  Location: [place] [facility];  Service: Cardiovascular;  Laterality: N/A;    There were no vitals filed for this visit.    Subjective Assessment - 05/21/20 1256    Subjective Patient saw the Dr. about a week ago for chronic L shoulder pain. Dr. indicated some bursitits and some decreased RTC strength accompanied by decreased ROM. Patient states pain has been ongoing for 6-7 years. She notes thatquick movements with driving or quick reaction movements aggravate it along with a catching sensation when raising arms up overhead. She indicates her pain has gotten worse over time. Patient also reports that she has bicep pain and posterior shoulder pain with spasm in rhomboid region underneath medial border of the scapula. She also tends to sleep on her side and notes that her pain does not limit her sleep but she cannot sleep flat on her back or on a slight incline without pain. Patient denies any nerve pain or any symptoms coming from neck region. She also indicated that she saw a chiropractor at some point for the same issue but was given 10+ exercises and felt increased pain and soreness and discontinued  the exercises.    Pertinent History DM,HTN, chronic Lt shoulder pain    Limitations Standing;Other (comment)   quick jerking movements/endurance related activities carrying etc.   Patient Stated Goals decrease pain, increase painfree motion and strength in L shoulder, continue weight loss journey, be able to carry and care for grandchildren    Currently in Pain? Yes    Pain Score 1    aware of the pain but at bay, at worst 7   Pain Location Shoulder    Pain Orientation Left;Lateral    Pain Descriptors / Indicators Jabbing    Pain Type Chronic pain    Pain Radiating Towards sometimes pain into distal bicep with overuse     Pain Onset More than a month ago    Pain Frequency Intermittent    Aggravating Factors  quick jerking movements, lifting carrying afterwards    Pain Relieving Factors icing and aleve, and heat    Effect of Pain on Daily Activities holding a baby, driving, quick movments and endurance              [facility] PT Assessment - 05/21/20 1257      Assessment   Medical Diagnosis Chronic Left shoulder pain    Referring Provider (PT) Eunice Blase, MD    Onset Date/Surgical Date 05/21/14    Hand Dominance Right    Next MD Visit nothing scheduled    Prior Therapy chiropractic care      Precautions   Precautions None      Restrictions   Weight Bearing Restrictions No      Balance Screen   Has the patient fallen in the past 6 months No    Has the patient had a decrease in activity level because of a fear of falling?  No   tripped on a curb 1 or 2 year ago   Is the patient reluctant to leave their home because of a fear of falling?  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   Leisure caring for grandchildren      Cognition   Overall Cognitive Status Within Functional Limits for tasks assessed      Observation/Other Assessments   Observations rounded shoulder posture seated and standing    Focus on Therapeutic Outcomes (FOTO)  59% functional intake score, goal is 68%      ROM / Strength   AROM / PROM / Strength AROM;PROM;Strength      AROM   Overall AROM Comments scapular movements for upward rotation and elevation normal and equal    AROM Assessment Site Shoulder;Cervical    Right/Left Shoulder Left;Right    Right Shoulder Extension --   WFL   Right Shoulder Flexion --   WFL   Right Shoulder ABduction --   [facility]   Right Shoulder Internal Rotation --   slight  limitation   Right Shoulder External Rotation --   slight limitation   Right Shoulder Horizontal  ADduction --   WFL   Left Shoulder Extension --   WFL   Left Shoulder Flexion --   WFL but painful arc 100-130 degrees   Left Shoulder ABduction --   WFL but painful arc 100-130 degrees   Left  Shoulder Internal Rotation 50 Degrees   painful   Left Shoulder External Rotation 65 Degrees   painful   Left Shoulder Horizontal ADduction --   WFL     PROM   PROM Assessment Site Shoulder    Right/Left Shoulder Left    Left Shoulder Internal Rotation 50 Degrees    Left Shoulder External Rotation 75 Degrees      Strength   Strength Assessment Site Shoulder    Right/Left Shoulder Right;Left    Right Shoulder Flexion 4+/5    Right Shoulder ABduction 4-/5    Right Shoulder Internal Rotation 4/5    Right Shoulder External Rotation 4+/5    Left Shoulder Flexion 4-/5    Left Shoulder ABduction 3/5    Left Shoulder Internal Rotation 3+/5    Left Shoulder External Rotation 3+/5      Palpation   Palpation comment upper trap tightness, tight rhomboids      Special Tests   Other special tests + hawkins kennedy test and yolcum test and neers test for shoulder impingment, inconclusive empty can test                      Objective measurements completed on examination: See above findings.               PT Education - 05/21/20 1400    Education Details HEP, POC for pt    Person(s) Educated Patient    Methods Explanation;Demonstration;Handout    Comprehension Verbalized understanding;Returned demonstration            PT Short Term Goals - 05/21/20 1430      PT SHORT TERM GOAL #1   Title FOTO score will improve by 4 points toward end goal    Baseline 59    Time 4    Period Weeks    Status New    Target Date 06/18/20      PT SHORT TERM GOAL #2   Title Patient will be compliant and independent with HEP    Baseline given HEP 05/21/20    Time 3    Period Weeks     Status New    Target Date 06/11/20             PT Long Term Goals - 05/21/20 1433      PT LONG TERM GOAL #1   Title Patient will improve FOTO score to 68% by discharge    Baseline 59%    Time 8    Period Weeks    Status New    Target Date 07/16/20      PT LONG TERM GOAL #2   Title Patient will increase strength of shoulder to 4+/5 for increased function of ADLs    Baseline 3 or 4/5 for all except flexion/extension    Time 8    Period Weeks    Status New    Target Date 07/16/20      PT LONG TERM GOAL #3   Title Patient will be able to move in all planes of motion without increased pain over 3/10    Baseline patient has pain in frontal and sagittal planes, transverse is painless but weak    Time 8    Period Weeks    Status New    Target Date 07/16/20                  Plan - 05/21/20 1416    Clinical Impression Statement Patient presents with  chronic Left shoulder pain that has been ongoing for tha past 6-7 years and has gotten worse. ROM is slightly limited with ER/IR movements and flexion and abduction are also painful within 100-130 ranges, she can go beyond this range without pain indicating painful arc. Within her range she presents with general muscle weakness of the RC and periscapular muscles indicate symtoms consistent with L shoulder impingement. Pain with quick reaction time and activities involving shoulder endurance limit her abilities for daily activities involving carrying/lifting/raising overhead. Patient can benefit from skilled PT for increased strenth of RC for increased endurance and increased strength as well as stretches and exercises to target limitations in motion and postural control. Patient can also benefit from Dry needling to decrease muscle tightness and muscle spasms in rhomboid region as well.    Examination-Activity Limitations Reach Overhead;Carry;Caring for Others    Examination-Participation Restrictions Driving;Community Activity     Clinical Decision Making Low    Rehab Potential Good    PT Frequency 2x / week    PT Duration 8 weeks    PT Treatment/Interventions Cryotherapy;Electrical Stimulation;Moist Heat;Functional mobility training;Therapeutic activities;Therapeutic exercise;Balance training;Neuromuscular re-education;Manual techniques;Passive range of motion;Dry needling;ADLs/Self Care Home Management    PT Next Visit Plan check in wih HEP, DN appointment, STM and progression of strength exercises    PT Home Exercise Plan Access Code: [facility]    Consulted and Agree with Plan of Care Patient           Patient will benefit from skilled therapeutic intervention in order to improve the following deficits and impairments:  Decreased range of motion,Increased muscle spasms,Pain,Decreased activity tolerance,Decreased balance,Impaired flexibility,Decreased strength,Decreased mobility,Postural dysfunction  Visit Diagnosis: Chronic left shoulder pain  Stiffness of left shoulder, not elsewhere classified  Muscle weakness (generalized)     Problem List Patient Active Problem List   Diagnosis Date Noted  . Family history of Alzheimer's disease 05/14/2020  . Diabetes ([place]) 05/14/2020  . Hypertension 05/14/2020  . Hyperlipidemia 05/14/2020  . Vitamin D deficiency 05/14/2020  . History of basal cell cancer 05/14/2020  . Nephrolithiasis 06/22/2018  . Cholecystitis 04/28/2016    Lynnda Shields, SPT 05/21/2020,   [facility] Physical Therapy [address] [place], [place], [address] Phone: [phone]   Fax:  [phone]  Name: SHARLEE POLSELLI MRN: [id number] Date of Birth: February 17, 1961

## 2020-05-21 NOTE — Patient Instructions (Signed)
Access Code: [facility] URL: [email or web address] Date: 05/21/2020 Prepared by: Elsie Ra  Exercises Sleeper Stretch - 2 x daily - 7 x weekly - 1 sets - 5 reps - 10 hold Doorway Pec Stretch at 60 Elevation - 2 x daily - 7 x weekly - 1 sets - 3 reps - 30 hold Standing Bilateral Low Shoulder Row with Anchored Resistance - 2 x daily - 7 x weekly - 2-3 sets - 10 reps Shoulder External Rotation with Anchored Resistance - 2 x daily - 7 x weekly - 2-3 sets - 10 reps Shoulder Internal Rotation with Resistance - 2 x daily - 7 x weekly - 2-3 sets - 10 reps

## 2020-05-22 NOTE — Addendum Note (Signed)
Addended by: Debbe Odea on: 05/22/2020 10:40 AM   Modules accepted: Orders

## 2020-05-23 ENCOUNTER — Encounter: Payer: 59 | Admitting: Physical Therapy

## 2020-06-03 ENCOUNTER — Ambulatory Visit: Payer: 59 | Admitting: Physical Therapy

## 2020-06-03 ENCOUNTER — Other Ambulatory Visit: Payer: Self-pay

## 2020-06-03 DIAGNOSIS — R600 Localized edema: Secondary | ICD-10-CM

## 2020-06-03 DIAGNOSIS — G8929 Other chronic pain: Secondary | ICD-10-CM

## 2020-06-03 DIAGNOSIS — M25512 Pain in left shoulder: Secondary | ICD-10-CM | POA: Diagnosis not present

## 2020-06-03 DIAGNOSIS — M25612 Stiffness of left shoulder, not elsewhere classified: Secondary | ICD-10-CM

## 2020-06-03 DIAGNOSIS — M6281 Muscle weakness (generalized): Secondary | ICD-10-CM | POA: Diagnosis not present

## 2020-06-03 NOTE — Therapy (Signed)
[facility] Physical Therapy [address] [place], [place], [address] Phone: [phone]   Fax:  [phone]  Physical Therapy Treatment  Patient Details  Name: Dana Townsend MRN: [id number] Date of Birth: May 19, 1960 Referring Provider (PT): Eunice Blase, MD   Encounter Date: 06/03/2020   PT End of Session - 06/03/20 1454    Visit Number 2    Number of Visits 14    Date for PT Re-Evaluation 07/16/20    PT Start Time 8250    PT Stop Time 1435    PT Time Calculation (min) 50 min    Activity Tolerance Patient tolerated treatment well;No increased pain    Behavior During Therapy WFL for tasks assessed/performed           Past Medical History:  Diagnosis Date  . Cancer ([place])   . Complication of anesthesia    Difficult to arouse  . Diabetes ([place]) 05/14/2020  . Diabetes mellitus type II, controlled, with no complications ([place])   . Dyspepsia   . Dysrhythmia    runs SVT/VT   WITH ANXIETY     . Hypertension   . Kidney stones   . Menopausal symptoms   . Pure hypercholesterolemia   . Supraventricular tachycardia ([place])   . Vitamin D deficiency     Past Surgical History:  Procedure Laterality Date  . CESAREAN SECTION    . CHOLECYSTECTOMY N/A 04/28/2016   Procedure: LAPAROSCOPIC CHOLECYSTECTOMY WITH INTRAOPERATIVE CHOLANGIOGRAM;  Surgeon: Excell Seltzer, MD;  Location: [place];  Service: General;  Laterality: N/A;  . GANGLION CYST EXCISION    . HISTORY KIDNEY STONES    . IR URETERAL STENT PLACEMENT EXISTING ACCESS LEFT  06/22/2018  . KIDNEY STONE SURGERY     left and right  . LAPAROSCOPY    . [place]  . NEPHROLITHOTOMY Left 06/22/2018   Procedure: NEPHROLITHOTOMY PERCUTANEOUS;  Surgeon: Franchot Gallo, MD;  [facility];  Service: Urology;  Laterality: Left;  3 HRS  . SVT ABLATION N/A 10/19/2018   Procedure: SVT ABLATION;  Surgeon: Constance Haw, MD;  Location: [place] [facility];  Service: Cardiovascular;  Laterality:  N/A;    There were no vitals filed for this visit.   Subjective Assessment - 06/03/20 1430    Subjective relays she is doing the exercises and they are all ok but the IR strengthening with band is hurting. She feels like she has overall more ROM with rotation but continues to have Lt shoulder pain and catching with painful arc reaching into flexion, abd or IR.    Pertinent History DM,HTN, chronic Lt shoulder pain    Limitations Standing;Other (comment)   quick jerking movements/endurance related activities carrying etc.   Patient Stated Goals decrease pain, increase painfree motion and strength in L shoulder, continue weight loss journey, be able to carry and care for grandchildren    Pain Onset More than a month ago             [facility] Adult PT Treatment/Exercise - 06/03/20 0001      Exercises   Exercises Shoulder      Shoulder Exercises: Prone   Flexion Limitations painful so discontinued    Other Prone Exercises Prone Y and I 2 sets of 10 in limited painfree ROM      Shoulder Exercises: Sidelying   Internal Rotation Left;Strengthening    Internal Rotation Weight (lbs) 2    Internal Rotation Limitations 2 sets of 10  Shoulder Exercises: Standing   External Rotation Left;10 reps    Theraband Level (Shoulder External Rotation) Level 3 (Green)    External Rotation Limitations cues for thumb up and towel roll at home    Internal Rotation Limitations Pain with resisted IR so moved to isometrics 5 sec X 10 reps on Lt      Shoulder Exercises: Pulleys   Flexion 2 minutes    ABduction 2 minutes      Shoulder Exercises: ROM/Strengthening   UBE (Upper Arm Bike) L2 3 min fwd, 3 min reverse      Shoulder Exercises: Stretch   Cross Chest Stretch 10 seconds;5 reps      Modalities   Modalities Vasopneumatic      Vasopneumatic   Number Minutes Vasopneumatic  10 minutes    Vasopnuematic Location  Shoulder    Vasopneumatic Pressure Medium    Vasopneumatic Temperature  34       Manual Therapy   Manual therapy comments 10 min total, Lt shoulder P-A mobs and gentle inferior mobs (more discomfort with inferior mobs), central P-A thoracic mobs grade 1-2, Lt scapular mobs                    PT Short Term Goals - 05/21/20 1430      PT SHORT TERM GOAL #1   Title FOTO score will improve by 4 points toward end goal    Baseline 59    Time 4    Period Weeks    Status New    Target Date 06/18/20      PT SHORT TERM GOAL #2   Title Patient will be compliant and independent with HEP    Baseline given HEP 05/21/20    Time 3    Period Weeks    Status New    Target Date 06/11/20             PT Long Term Goals - 05/21/20 1433      PT LONG TERM GOAL #1   Title Patient will improve FOTO score to 68% by discharge    Baseline 59%    Time 8    Period Weeks    Status New    Target Date 07/16/20      PT LONG TERM GOAL #2   Title Patient will increase strength of shoulder to 4+/5 for increased function of ADLs    Baseline 3 or 4/5 for all except flexion/extension    Time 8    Period Weeks    Status New    Target Date 07/16/20      PT LONG TERM GOAL #3   Title Patient will be able to move in all planes of motion without increased pain over 3/10    Baseline patient has pain in frontal and sagittal planes, transverse is painless but weak    Time 8    Period Weeks    Status New    Target Date 07/16/20                 Plan - 06/03/20 1455    Clinical Impression Statement She had pain with standing IR with tband so this was modified to isometrics and SL IR with 2 lbs and she was able to perform this without pain or difficulty. Her HEP was revised to reflect this. Then focused on Lt shoulder mobililty and scapular strength as tolerated. Used vaso post tx to reduce overall edema and inflammation and she relays positive return  from this. She will try DN to rhomboid trigger point next visit.    Examination-Activity Limitations Reach  Overhead;Carry;Caring for Others    Examination-Participation Restrictions Driving;Community Activity    Rehab Potential Good    PT Frequency 2x / week    PT Duration 8 weeks    PT Treatment/Interventions Cryotherapy;Electrical Stimulation;Moist Heat;Functional mobility training;Therapeutic activities;Therapeutic exercise;Balance training;Neuromuscular re-education;Manual techniques;Passive range of motion;Dry needling;ADLs/Self Care Home Management    PT Next Visit Plan DN appointment, STM and progression of strength exercises    PT Home Exercise Plan Access Code: [facility]    Consulted and Agree with Plan of Care Patient           Patient will benefit from skilled therapeutic intervention in order to improve the following deficits and impairments:  Decreased range of motion,Increased muscle spasms,Pain,Decreased activity tolerance,Decreased balance,Impaired flexibility,Decreased strength,Decreased mobility,Postural dysfunction  Visit Diagnosis: Chronic left shoulder pain  Stiffness of left shoulder, not elsewhere classified  Muscle weakness (generalized)  Localized edema     Problem List Patient Active Problem List   Diagnosis Date Noted  . Family history of Alzheimer's disease 05/14/2020  . Diabetes ([place]) 05/14/2020  . Hypertension 05/14/2020  . Hyperlipidemia 05/14/2020  . Vitamin D deficiency 05/14/2020  . History of basal cell cancer 05/14/2020  . Nephrolithiasis 06/22/2018  . Cholecystitis 04/28/2016    Debbe Odea, PT,DPT 06/03/2020, 2:58 PM  [facility] Physical Therapy [address] [place], [place], [address] Phone: [phone]   Fax:  [phone]  Name: Dana Townsend MRN: [id number] Date of Birth: [birth year]

## 2020-06-05 ENCOUNTER — Ambulatory Visit (INDEPENDENT_AMBULATORY_CARE_PROVIDER_SITE_OTHER): Payer: 59 | Admitting: Physical Therapy

## 2020-06-05 ENCOUNTER — Other Ambulatory Visit: Payer: Self-pay

## 2020-06-05 ENCOUNTER — Encounter: Payer: Self-pay | Admitting: Physical Therapy

## 2020-06-05 DIAGNOSIS — R600 Localized edema: Secondary | ICD-10-CM

## 2020-06-05 DIAGNOSIS — M25512 Pain in left shoulder: Secondary | ICD-10-CM

## 2020-06-05 DIAGNOSIS — M6281 Muscle weakness (generalized): Secondary | ICD-10-CM | POA: Diagnosis not present

## 2020-06-05 DIAGNOSIS — M25612 Stiffness of left shoulder, not elsewhere classified: Secondary | ICD-10-CM

## 2020-06-05 DIAGNOSIS — G8929 Other chronic pain: Secondary | ICD-10-CM

## 2020-06-05 NOTE — Therapy (Signed)
[facility] [address] [place], [place], [address] Phone: [phone]   Fax:  [phone]  Physical Therapy Treatment  Patient Details  Name: Dana Townsend MRN: [id number] Date of Birth: 06-Jun-1960 Referring Provider (PT): Eunice Blase, MD   Encounter Date: 06/05/2020   PT End of Session - 06/05/20 1424    Visit Number 3    Number of Visits 14    Date for PT Re-Evaluation 07/16/20    PT Start Time [id number]    PT Stop Time 1423    PT Time Calculation (min) 40 min    Activity Tolerance Patient tolerated treatment well;No increased pain    Behavior During Therapy WFL for tasks assessed/performed           Past Medical History:  Diagnosis Date  . Cancer ([place])   . Complication of anesthesia    Difficult to arouse  . Diabetes ([place]) 05/14/2020  . Diabetes mellitus type II, controlled, with no complications ([place])   . Dyspepsia   . Dysrhythmia    runs SVT/VT   WITH ANXIETY     . Hypertension   . Kidney stones   . Menopausal symptoms   . Pure hypercholesterolemia   . Supraventricular tachycardia ([place])   . Vitamin D deficiency     Past Surgical History:  Procedure Laterality Date  . CESAREAN SECTION    . CHOLECYSTECTOMY N/A 04/28/2016   Procedure: LAPAROSCOPIC CHOLECYSTECTOMY WITH INTRAOPERATIVE CHOLANGIOGRAM;  Surgeon: Excell Seltzer, MD;  Location: [place];  Service: General;  Laterality: N/A;  . GANGLION CYST EXCISION    . HISTORY KIDNEY STONES    . IR URETERAL STENT PLACEMENT EXISTING ACCESS LEFT  06/22/2018  . KIDNEY STONE SURGERY     left and right  . LAPAROSCOPY    . [place]  . NEPHROLITHOTOMY Left 06/22/2018   Procedure: NEPHROLITHOTOMY PERCUTANEOUS;  Surgeon: Franchot Gallo, MD;  [facility];  Service: Urology;  Laterality: Left;  3 HRS  . SVT ABLATION N/A 10/19/2018   Procedure: SVT ABLATION;  Surgeon: Constance Haw, MD;  Location: [facility];  Service: Cardiovascular;  Laterality:  N/A;    There were no vitals filed for this visit.   Subjective Assessment - 06/05/20 1345    Subjective back is doing okay, but not doing anything to aggravate it.  shoulder is doing pretty well.    Pertinent History DM,HTN, chronic Lt shoulder pain    Limitations Standing;Other (comment)   quick jerking movements/endurance related activities carrying etc.   Patient Stated Goals decrease pain, increase painfree motion and strength in L shoulder, continue weight loss journey, be able to carry and care for grandchildren    Currently in Pain? Yes    Pain Score 2     Pain Location Shoulder    Pain Orientation Left;Lateral    Pain Descriptors / Indicators Burning   pulling   Pain Type Chronic pain    Pain Onset More than a month ago    Pain Frequency Intermittent    Aggravating Factors  quick movements, lifting and carrying afterwards    Pain Relieving Factors ice, aleve, heat                             [facility] Adult PT Treatment/Exercise - 06/05/20 1343      Self-Care   Self-Care Other Self-Care Comments    Other Self-Care Comments  recommeded trial  of supine lying to see how muscle responds, as well as 30 min exercise daily to help with inflammation; also discussed sleep and sugar impacts on inflammation      Shoulder Exercises: ROM/Strengthening   UBE (Upper Arm Bike) L2 3 min fwd, 3 min reverse      Manual Therapy   Manual therapy comments compression and STM to Lt rhomboids and subscap (ant)            Trigger Point Dry Needling - 06/05/20 1424    Consent Given? Yes    Education Handout Provided Yes    Muscles Treated Upper Quadrant Rhomboids;Subscapularis    Rhomboids Response Twitch response elicited    Subscapularis Response Twitch response elicited                  PT Short Term Goals - 05/21/20 1430      PT SHORT TERM GOAL #1   Title FOTO score will improve by 4 points toward end goal    Baseline 59    Time 4    Period Weeks     Status New    Target Date 06/18/20      PT SHORT TERM GOAL #2   Title Patient will be compliant and independent with HEP    Baseline given HEP 05/21/20    Time 3    Period Weeks    Status New    Target Date 06/11/20             PT Long Term Goals - 05/21/20 1433      PT LONG TERM GOAL #1   Title Patient will improve FOTO score to 68% by discharge    Baseline 59%    Time 8    Period Weeks    Status New    Target Date 07/16/20      PT LONG TERM GOAL #2   Title Patient will increase strength of shoulder to 4+/5 for increased function of ADLs    Baseline 3 or 4/5 for all except flexion/extension    Time 8    Period Weeks    Status New    Target Date 07/16/20      PT LONG TERM GOAL #3   Title Patient will be able to move in all planes of motion without increased pain over 3/10    Baseline patient has pain in frontal and sagittal planes, transverse is painless but weak    Time 8    Period Weeks    Status New    Target Date 07/16/20                 Plan - 06/05/20 1425    Clinical Impression Statement Pt tolerated session well today with trial of DN.  She has had minimal spasms since startig PT but hasn't put herself into provoking positions.  Discussed trial of that before next week, so will see how she responds.  Will continue to benefit from PT to maximize funciton.    Examination-Activity Limitations Reach Overhead;Carry;Caring for Others    Examination-Participation Restrictions Driving;Community Activity    Rehab Potential Good    PT Frequency 2x / week    PT Duration 8 weeks    PT Treatment/Interventions Cryotherapy;Electrical Stimulation;Moist Heat;Functional mobility training;Therapeutic activities;Therapeutic exercise;Balance training;Neuromuscular re-education;Manual techniques;Passive range of motion;Dry needling;ADLs/Self Care Home Management    PT Next Visit Plan DN appointment, STM and progression of strength exercises; assess response to DN    PT  Home Exercise Plan  Access Code: [facility]    Consulted and Agree with Plan of Care Patient           Patient will benefit from skilled therapeutic intervention in order to improve the following deficits and impairments:  Decreased range of motion,Increased muscle spasms,Pain,Decreased activity tolerance,Decreased balance,Impaired flexibility,Decreased strength,Decreased mobility,Postural dysfunction  Visit Diagnosis: Chronic left shoulder pain  Stiffness of left shoulder, not elsewhere classified  Muscle weakness (generalized)  Localized edema     Problem List Patient Active Problem List   Diagnosis Date Noted  . Family history of Alzheimer's disease 05/14/2020  . Diabetes ([place]) 05/14/2020  . Hypertension 05/14/2020  . Hyperlipidemia 05/14/2020  . Vitamin D deficiency 05/14/2020  . History of basal cell cancer 05/14/2020  . Nephrolithiasis 06/22/2018  . Cholecystitis 04/28/2016      Laureen Abrahams, PT, DPT 06/05/20 2:27 PM    [facility] [address] [place], [place], [address] Phone: [phone]   Fax:  [phone]  Name: Dana Townsend MRN: [id number] Date of Birth: 21-Mar-1961

## 2020-06-09 ENCOUNTER — Ambulatory Visit (INDEPENDENT_AMBULATORY_CARE_PROVIDER_SITE_OTHER): Payer: 59 | Admitting: Physical Therapy

## 2020-06-09 ENCOUNTER — Other Ambulatory Visit: Payer: Self-pay

## 2020-06-09 DIAGNOSIS — M25612 Stiffness of left shoulder, not elsewhere classified: Secondary | ICD-10-CM | POA: Diagnosis not present

## 2020-06-09 DIAGNOSIS — R600 Localized edema: Secondary | ICD-10-CM

## 2020-06-09 DIAGNOSIS — M25512 Pain in left shoulder: Secondary | ICD-10-CM

## 2020-06-09 DIAGNOSIS — M6281 Muscle weakness (generalized): Secondary | ICD-10-CM | POA: Diagnosis not present

## 2020-06-09 DIAGNOSIS — G8929 Other chronic pain: Secondary | ICD-10-CM

## 2020-06-09 NOTE — Therapy (Signed)
[facility] [address] [place], [place], [address] Phone: [phone]   Fax:  [phone]  Physical Therapy Treatment  Patient Details  Name: Dana Townsend MRN: [id number] Date of Birth: Oct 30, 1960 Referring Provider (PT): Eunice Blase, MD   Encounter Date: 06/09/2020   PT End of Session - 06/09/20 1151    Visit Number 4    Number of Visits 14    Date for PT Re-Evaluation 07/16/20    PT Start Time 1055    PT Stop Time 4742    PT Time Calculation (min) 47 min    Activity Tolerance Patient tolerated treatment well;No increased pain    Behavior During Therapy WFL for tasks assessed/performed           Past Medical History:  Diagnosis Date  . Cancer ([place])   . Complication of anesthesia    Difficult to arouse  . Diabetes ([place]) 05/14/2020  . Diabetes mellitus type II, controlled, with no complications ([place])   . Dyspepsia   . Dysrhythmia    runs SVT/VT   WITH ANXIETY     . Hypertension   . Kidney stones   . Menopausal symptoms   . Pure hypercholesterolemia   . Supraventricular tachycardia ([place])   . Vitamin D deficiency     Past Surgical History:  Procedure Laterality Date  . CESAREAN SECTION    . CHOLECYSTECTOMY N/A 04/28/2016   Procedure: LAPAROSCOPIC CHOLECYSTECTOMY WITH INTRAOPERATIVE CHOLANGIOGRAM;  Surgeon: Excell Seltzer, MD;  Location: [facility];  Service: General;  Laterality: N/A;  . GANGLION CYST EXCISION    . HISTORY KIDNEY STONES    . IR URETERAL STENT PLACEMENT EXISTING ACCESS LEFT  06/22/2018  . KIDNEY STONE SURGERY     left and right  . LAPAROSCOPY    . [place]  . NEPHROLITHOTOMY Left 06/22/2018   Procedure: NEPHROLITHOTOMY PERCUTANEOUS;  Surgeon: Franchot Gallo, MD;  [facility];  Service: Urology;  Laterality: Left;  3 HRS  . SVT ABLATION N/A 10/19/2018   Procedure: SVT ABLATION;  Surgeon: Constance Haw, MD;  Location: [facility];  Service: Cardiovascular;  Laterality:  N/A;    There were no vitals filed for this visit.   Subjective Assessment - 06/09/20 1058    Subjective Patient has noticed some improvement after DN last visit, she did try laying reclined in her painful position and found that the painful spot had moved slightly upwards of where it used to be. Overall shoulder is doing well though.    Pertinent History DM,HTN, chronic Lt shoulder pain    Limitations Standing;Other (comment)   quick jerking movements/endurance related activities carrying etc.   Patient Stated Goals decrease pain, increase painfree motion and strength in L shoulder, continue weight loss journey, be able to carry and care for grandchildren    Currently in Pain? No/denies    Pain Onset More than a month ago            [facility] Adult PT Treatment/Exercise - 06/09/20 0001      Shoulder Exercises: Supine   Other Supine Exercises --      Shoulder Exercises: Seated   Other Seated Exercises scapular retraction 3 sec hold with 3 sec barrel hold 10x, crossover stretch with thoracic extension 10x      Shoulder Exercises: Prone   Other Prone Exercises Prone Y and I 10x in limited painfree ROM    Other Prone Exercises thoracic extension press up on elbows  30 secx3      Shoulder Exercises: Standing   External Rotation Left;10 reps;Right    Theraband Level (Shoulder External Rotation) Level 3 (Green)    External Rotation Limitations cues for thumb up and towel roll at home    Other Standing Exercises 90/90 doorway stretch 30 sec x3      Shoulder Exercises: Pulleys   Flexion 2 minutes    ABduction 2 minutes      Shoulder Exercises: ROM/Strengthening   UBE (Upper Arm Bike) L2 3 min fwd, 3 min reverse      Manual Therapy   Manual therapy comments thoracic P-A mobs grade 3 10 mins                    PT Short Term Goals - 05/21/20 1430      PT SHORT TERM GOAL #1   Title FOTO score will improve by 4 points toward end goal    Baseline 59    Time 4    Period  Weeks    Status New    Target Date 06/18/20      PT SHORT TERM GOAL #2   Title Patient will be compliant and independent with HEP    Baseline given HEP 05/21/20    Time 3    Period Weeks    Status New    Target Date 06/11/20             PT Long Term Goals - 05/21/20 1433      PT LONG TERM GOAL #1   Title Patient will improve FOTO score to 68% by discharge    Baseline 59%    Time 8    Period Weeks    Status New    Target Date 07/16/20      PT LONG TERM GOAL #2   Title Patient will increase strength of shoulder to 4+/5 for increased function of ADLs    Baseline 3 or 4/5 for all except flexion/extension    Time 8    Period Weeks    Status New    Target Date 07/16/20      PT LONG TERM GOAL #3   Title Patient will be able to move in all planes of motion without increased pain over 3/10    Baseline patient has pain in frontal and sagittal planes, transverse is painless but weak    Time 8    Period Weeks    Status New    Target Date 07/16/20                 Plan - 06/09/20 1302    Clinical Impression Statement Patient is doing well but mostly still avoiding any painful movements. Patient was able to tolerate thoracic mobilizations and shoulder exercises well with slight twinges of pain in bicep/deltoid region with forward reaching/scapular protraction. Check in after DN next visit to see if pain provoking position changes muscle spasm location again/provides relief for extended time in supine.    Examination-Activity Limitations Reach Overhead;Carry;Caring for Others    Examination-Participation Restrictions Driving;Community Activity    Rehab Potential Good    PT Frequency 2x / week    PT Duration 8 weeks    PT Treatment/Interventions Cryotherapy;Electrical Stimulation;Moist Heat;Functional mobility training;Therapeutic activities;Therapeutic exercise;Balance training;Neuromuscular re-education;Manual techniques;Passive range of motion;Dry needling;ADLs/Self Care  Home Management    PT Next Visit Plan resess response to DN, update and progress HEP as she can tolerate    PT Home Exercise Plan Access  Code: [email or web address]    Consulted and Agree with Plan of Care Patient           Patient will benefit from skilled therapeutic intervention in order to improve the following deficits and impairments:  Decreased range of motion,Increased muscle spasms,Pain,Decreased activity tolerance,Decreased balance,Impaired flexibility,Decreased strength,Decreased mobility,Postural dysfunction  Visit Diagnosis: Chronic left shoulder pain  Stiffness of left shoulder, not elsewhere classified  Muscle weakness (generalized)  Localized edema     Problem List Patient Active Problem List   Diagnosis Date Noted  . Family history of Alzheimer's disease 05/14/2020  . Diabetes ([place]) 05/14/2020  . Hypertension 05/14/2020  . Hyperlipidemia 05/14/2020  . Vitamin D deficiency 05/14/2020  . History of basal cell cancer 05/14/2020  . Nephrolithiasis 06/22/2018  . Cholecystitis 04/28/2016    Glenetta Hew, SPT 06/09/2020, 1:07 PM   During this treatment session, this physical therapist was present, participating in and directing the treatment.   This note has been reviewed and this clinician agrees with the information provided.  Elsie Ra, PT, DPT 06/09/20 2:17 PM   [facility] [address] [place], [place], [address] Phone: [phone]   Fax:  [phone]  Name: Dana Townsend MRN: [id number] Date of Birth: 22-Apr-1961

## 2020-06-11 ENCOUNTER — Encounter: Payer: Self-pay | Admitting: Physical Therapy

## 2020-06-11 ENCOUNTER — Ambulatory Visit: Payer: 59 | Admitting: Physical Therapy

## 2020-06-11 ENCOUNTER — Other Ambulatory Visit: Payer: Self-pay

## 2020-06-11 DIAGNOSIS — M6281 Muscle weakness (generalized): Secondary | ICD-10-CM | POA: Diagnosis not present

## 2020-06-11 DIAGNOSIS — G8929 Other chronic pain: Secondary | ICD-10-CM

## 2020-06-11 DIAGNOSIS — M25512 Pain in left shoulder: Secondary | ICD-10-CM | POA: Diagnosis not present

## 2020-06-11 DIAGNOSIS — M25612 Stiffness of left shoulder, not elsewhere classified: Secondary | ICD-10-CM | POA: Diagnosis not present

## 2020-06-11 DIAGNOSIS — R600 Localized edema: Secondary | ICD-10-CM

## 2020-06-11 NOTE — Therapy (Signed)
[facility] [address] [place], [place], [address] Phone: [phone]   Fax:  [phone]  Physical Therapy Treatment  Patient Details  Name: Dana Townsend MRN: [id number] Date of Birth: Aug 28, 1960 Referring Provider (PT): Eunice Blase, MD   Encounter Date: 06/11/2020   PT End of Session - 06/11/20 1242    Visit Number 5    Number of Visits 14    Date for PT Re-Evaluation 07/16/20    PT Start Time 4270    PT Stop Time 1221    PT Time Calculation (min) 39 min    Activity Tolerance Patient tolerated treatment well;No increased pain    Behavior During Therapy WFL for tasks assessed/performed           Past Medical History:  Diagnosis Date  . Cancer ([place])   . Complication of anesthesia    Difficult to arouse  . Diabetes ([place]) 05/14/2020  . Diabetes mellitus type II, controlled, with no complications ([place])   . Dyspepsia   . Dysrhythmia    runs SVT/VT   WITH ANXIETY     . Hypertension   . Kidney stones   . Menopausal symptoms   . Pure hypercholesterolemia   . Supraventricular tachycardia ([place])   . Vitamin D deficiency     Past Surgical History:  Procedure Laterality Date  . CESAREAN SECTION    . CHOLECYSTECTOMY N/A 04/28/2016   Procedure: LAPAROSCOPIC CHOLECYSTECTOMY WITH INTRAOPERATIVE CHOLANGIOGRAM;  Surgeon: Excell Seltzer, MD;  Location: [facility];  Service: General;  Laterality: N/A;  . GANGLION CYST EXCISION    . HISTORY KIDNEY STONES    . IR URETERAL STENT PLACEMENT EXISTING ACCESS LEFT  06/22/2018  . KIDNEY STONE SURGERY     left and right  . LAPAROSCOPY    . [place]  . NEPHROLITHOTOMY Left 06/22/2018   Procedure: NEPHROLITHOTOMY PERCUTANEOUS;  Surgeon: Franchot Gallo, MD;  [facility];  Service: Urology;  Laterality: Left;  3 HRS  . SVT ABLATION N/A 10/19/2018   Procedure: SVT ABLATION;  Surgeon: Constance Haw, MD;  Location: [facility];  Service: Cardiovascular;  Laterality:  N/A;    There were no vitals filed for this visit.   Subjective Assessment - 06/11/20 1139    Subjective had some benefit from DN, still havings a spasm in a semi reclined position.    Pertinent History DM,HTN, chronic Lt shoulder pain    Limitations Standing;Other (comment)   quick jerking movements/endurance related activities carrying etc.   Patient Stated Goals decrease pain, increase painfree motion and strength in L shoulder, continue weight loss journey, be able to carry and care for grandchildren    Currently in Pain? No/denies    Pain Onset More than a month ago                             [facility] Adult PT Treatment/Exercise - 06/11/20 1143      Therapeutic Activites    Therapeutic Activities Other Therapeutic Activities    Other Therapeutic Activities semi reclined position  6-7 min in attempt to reproduce spasm without success.  Palpation did identify area of concerns.      Shoulder Exercises: ROM/Strengthening   UBE (Upper Arm Bike) L3 3 min fwd, 3 min reverse      Manual Therapy   Manual therapy comments compression and STM to Lt rhomboids including rib mobs; use of percussive device  for [facility]            Trigger Point Dry Needling - 06/11/20 1242    Consent Given? Yes    Education Handout Provided Yes    Muscles Treated Upper Quadrant Rhomboids    Rhomboids Response Twitch response elicited                  PT Short Term Goals - 06/11/20 1245      PT SHORT TERM GOAL #1   Title FOTO score will improve by 4 points toward end goal    Baseline 59    Time 4    Period Weeks    Status On-going    Target Date 06/18/20      PT SHORT TERM GOAL #2   Title Patient will be compliant and independent with HEP    Baseline given HEP 05/21/20    Time 3    Period Weeks    Status Achieved    Target Date 06/11/20             PT Long Term Goals - 05/21/20 1433      PT LONG TERM GOAL #1   Title Patient will improve FOTO score to 68% by  discharge    Baseline 59%    Time 8    Period Weeks    Status New    Target Date 07/16/20      PT LONG TERM GOAL #2   Title Patient will increase strength of shoulder to 4+/5 for increased function of ADLs    Baseline 3 or 4/5 for all except flexion/extension    Time 8    Period Weeks    Status New    Target Date 07/16/20      PT LONG TERM GOAL #3   Title Patient will be able to move in all planes of motion without increased pain over 3/10    Baseline patient has pain in frontal and sagittal planes, transverse is painless but weak    Time 8    Period Weeks    Status New    Target Date 07/16/20                 Plan - 06/11/20 1245    Clinical Impression Statement Pt tolerated session well today with positive response to DN at this time.  Will continue to benefit from PT to maximize function.  Discussed trial of sitting in car semi reclined before session to see if she can trigger spasm, as our mat tables are much firmer.    Examination-Activity Limitations Reach Overhead;Carry;Caring for Others    Examination-Participation Restrictions Driving;Community Activity    Rehab Potential Good    PT Frequency 2x / week    PT Duration 8 weeks    PT Treatment/Interventions Cryotherapy;Electrical Stimulation;Moist Heat;Functional mobility training;Therapeutic activities;Therapeutic exercise;Balance training;Neuromuscular re-education;Manual techniques;Passive range of motion;Dry needling;ADLs/Self Care Home Management    PT Next Visit Plan resess response to DN, update and progress HEP as she can tolerate    PT Home Exercise Plan Access Code: [facility]    Consulted and Agree with Plan of Care Patient           Patient will benefit from skilled therapeutic intervention in order to improve the following deficits and impairments:  Decreased range of motion,Increased muscle spasms,Pain,Decreased activity tolerance,Decreased balance,Impaired flexibility,Decreased strength,Decreased  mobility,Postural dysfunction  Visit Diagnosis: Chronic left shoulder pain  Stiffness of left shoulder, not elsewhere classified  Muscle weakness (generalized)  Localized edema  Problem List Patient Active Problem List   Diagnosis Date Noted  . Family history of Alzheimer's disease 05/14/2020  . Diabetes ([place]) 05/14/2020  . Hypertension 05/14/2020  . Hyperlipidemia 05/14/2020  . Vitamin D deficiency 05/14/2020  . History of basal cell cancer 05/14/2020  . Nephrolithiasis 06/22/2018  . Cholecystitis 04/28/2016     Laureen Abrahams, PT, DPT 06/11/20 12:49 PM    [facility] [address] [place], [place], [address] Phone: [phone]   Fax:  [phone]  Name: AJANI SCHNIEDERS MRN: [id number] Date of Birth: [birth year]

## 2020-06-20 ENCOUNTER — Encounter: Payer: Self-pay | Admitting: Rehabilitative and Restorative Service Providers"

## 2020-06-20 ENCOUNTER — Encounter: Payer: 59 | Admitting: Rehabilitative and Restorative Service Providers"

## 2020-06-20 ENCOUNTER — Ambulatory Visit (INDEPENDENT_AMBULATORY_CARE_PROVIDER_SITE_OTHER): Payer: 59 | Admitting: Rehabilitative and Restorative Service Providers"

## 2020-06-20 ENCOUNTER — Other Ambulatory Visit: Payer: Self-pay

## 2020-06-20 DIAGNOSIS — G8929 Other chronic pain: Secondary | ICD-10-CM

## 2020-06-20 DIAGNOSIS — M25612 Stiffness of left shoulder, not elsewhere classified: Secondary | ICD-10-CM

## 2020-06-20 DIAGNOSIS — M6281 Muscle weakness (generalized): Secondary | ICD-10-CM

## 2020-06-20 DIAGNOSIS — M25512 Pain in left shoulder: Secondary | ICD-10-CM

## 2020-06-20 DIAGNOSIS — R600 Localized edema: Secondary | ICD-10-CM

## 2020-06-20 NOTE — Therapy (Addendum)
[facility] [address] [place], [place], [address] Phone: [phone]   Fax:  [phone]  Note / Discharge Note  Patient Details  Name: Dana Townsend MRN: [id number] Date of Birth: [birth year] Referring Provider (PT): Eunice Blase, MD   Encounter Date: 06/20/2020   PT End of Session - 06/20/20 1205    Visit Number 5    Number of Visits 14    Date for PT Re-Evaluation 07/16/20    PT Start Time 7342    PT Stop Time 1150    PT Time Calculation (min) 5 min           Past Medical History:  Diagnosis Date  . Cancer ([place])   . Complication of anesthesia    Difficult to arouse  . Diabetes ([place]) 05/14/2020  . Diabetes mellitus type II, controlled, with no complications ([place])   . Dyspepsia   . Dysrhythmia    runs SVT/VT   WITH ANXIETY     . Hypertension   . Kidney stones   . Menopausal symptoms   . Pure hypercholesterolemia   . Supraventricular tachycardia ([place])   . Vitamin D deficiency     Past Surgical History:  Procedure Laterality Date  . CESAREAN SECTION    . CHOLECYSTECTOMY N/A 04/28/2016   Procedure: LAPAROSCOPIC CHOLECYSTECTOMY WITH INTRAOPERATIVE CHOLANGIOGRAM;  Surgeon: Excell Seltzer, MD;  Location: [place];  Service: General;  Laterality: N/A;  . GANGLION CYST EXCISION    . HISTORY KIDNEY STONES    . IR URETERAL STENT PLACEMENT EXISTING ACCESS LEFT  06/22/2018  . KIDNEY STONE SURGERY     left and right  . LAPAROSCOPY    . [place]  . NEPHROLITHOTOMY Left 06/22/2018   Procedure: NEPHROLITHOTOMY PERCUTANEOUS;  Surgeon: Franchot Gallo, MD;  [facility];  Service: Urology;  Laterality: Left;  3 HRS  . SVT ABLATION N/A 10/19/2018   Procedure: SVT ABLATION;  Surgeon: Constance Haw, MD;  Location: [facility];  Service: Cardiovascular;  Laterality: N/A;    There were no vitals filed for this visit.   Subjective Assessment - 06/20/20 1204    Subjective Pt. indicated feeling like she  had trouble with some of the exercises c resistance and indicated she adjusted to no resistance.  Pt. indicated that the cost of visits were too much and she didn't think she could continue.  Pt. stated overall she wasn't sure if she was getting better or not.    Pertinent History DM,HTN, chronic Lt shoulder pain    Limitations Standing;Other (comment)   quick jerking movements/endurance related activities carrying etc.   Patient Stated Goals decrease pain, increase painfree motion and strength in L shoulder, continue weight loss journey, be able to carry and care for grandchildren    Pain Onset More than a month ago                                       PT Short Term Goals - 06/11/20 1245      PT SHORT TERM GOAL #1   Title FOTO score will improve by 4 points toward end goal    Baseline 59    Time 4    Period Weeks    Status On-going    Target Date 06/18/20      PT SHORT TERM GOAL #2   Title Patient will be  compliant and independent with HEP    Baseline given HEP 05/21/20    Time 3    Period Weeks    Status Achieved    Target Date 06/11/20             PT Long Term Goals - 05/21/20 1433      PT LONG TERM GOAL #1   Title Patient will improve FOTO score to 68% by discharge    Baseline 59%    Time 8    Period Weeks    Status New    Target Date 07/16/20      PT LONG TERM GOAL #2   Title Patient will increase strength of shoulder to 4+/5 for increased function of ADLs    Baseline 3 or 4/5 for all except flexion/extension    Time 8    Period Weeks    Status New    Target Date 07/16/20      PT LONG TERM GOAL #3   Title Patient will be able to move in all planes of motion without increased pain over 3/10    Baseline patient has pain in frontal and sagittal planes, transverse is painless but weak    Time 8    Period Weeks    Status New    Target Date 07/16/20                  Patient will benefit from skilled therapeutic  intervention in order to improve the following deficits and impairments:     Visit Diagnosis: Chronic left shoulder pain  Stiffness of left shoulder, not elsewhere classified  Muscle weakness (generalized)  Localized edema     Problem List Patient Active Problem List   Diagnosis Date Noted  . Family history of Alzheimer's disease 05/14/2020  . Diabetes ([place]) 05/14/2020  . Hypertension 05/14/2020  . Hyperlipidemia 05/14/2020  . Vitamin D deficiency 05/14/2020  . History of basal cell cancer 05/14/2020  . Nephrolithiasis 06/22/2018  . Cholecystitis 04/28/2016    Scot Jun, PT, DPT, OCS, ATC 06/20/20  12:06 PM  PHYSICAL THERAPY DISCHARGE SUMMARY  Visits from Start of Care: 5  Current functional level related to goals / functional outcomes: See note   Remaining deficits: See note   Education / Equipment: HEP Plan: Patient agrees to discharge.  Patient goals were partially met. Patient is being discharged due to not returning since the last visit.  ?????    Scot Jun, PT, DPT, OCS, ATC 08/06/20  3:02 PM     [facility] [address] [place], [place], [address] Phone: [phone]   Fax:  [phone]  Name: Dana Townsend MRN: [id number] Date of Birth: 03-19-1961

## 2020-06-23 ENCOUNTER — Encounter: Payer: 59 | Admitting: Physical Therapy

## 2020-06-30 ENCOUNTER — Encounter: Payer: 59 | Admitting: Physical Therapy

## 2020-09-26 ENCOUNTER — Telehealth: Payer: Self-pay

## 2020-09-26 NOTE — Telephone Encounter (Signed)
Patient called regarding the letter she received about Dr.Hilts leaving our practice she is requesting a call back with info regarding his new practice call back:[phone]

## 2021-01-29 ENCOUNTER — Other Ambulatory Visit: Payer: Self-pay | Admitting: Cardiology

## 2021-02-09 ENCOUNTER — Encounter: Payer: Self-pay | Admitting: Cardiology

## 2021-02-09 ENCOUNTER — Other Ambulatory Visit: Payer: Self-pay

## 2021-02-09 ENCOUNTER — Ambulatory Visit (INDEPENDENT_AMBULATORY_CARE_PROVIDER_SITE_OTHER): Payer: 59 | Admitting: Cardiology

## 2021-02-09 VITALS — BP 128/68 | HR 54 | Ht 63.0 in | Wt 168.8 lb

## 2021-02-09 DIAGNOSIS — I491 Atrial premature depolarization: Secondary | ICD-10-CM

## 2021-02-09 NOTE — Progress Notes (Signed)
Electrophysiology Office Note   Date:  02/09/2021   ID:  Dana Townsend, DOB 1961-05-01, MRN [id number]  PCP:  Eunice Blase, MD  Cardiologist:   Primary Electrophysiologist:  Amahri Dengel Meredith Leeds, MD    No chief complaint on file.    History of Present Illness: Dana Townsend is a 60 y.o. female who is being seen today for the evaluation of presyncope at the request of Hilts, Michael, MD. Presenting today for electrophysiology evaluation.    She has a history significant for diabetes, hyperlipidemia, vitamin D deficiency, and palpitations.  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  She has also had atrial tachycardia in the past.  She had coronavirus infection and had palpitations around the time of that infection.  These palpitations lasted for approximately 2 months and occur mainly at night.  They were self resolving.  Today, denies symptoms of palpitations, chest pain, shortness of breath, orthopnea, PND, lower extremity edema, claudication, dizziness, presyncope, syncope, bleeding, or neurologic sequela. The patient is tolerating medications without difficulties.  She is feeling well.  She only has intermittent palpitations.  Palpitations occur once every few months.  She is overall comfortable with her control.  She feels like her palpitations are exacerbated by stress.  She is also lost up to 80 pounds.  She feels much improved with much more energy.  Past Medical History:  Diagnosis Date   Cancer ([place])    Complication of anesthesia    Difficult to arouse   Diabetes ([place]) 05/14/2020   Diabetes mellitus type II, controlled, with no complications ([place])    Dyspepsia    Dysrhythmia    runs SVT/VT   WITH ANXIETY      Hypertension    Kidney stones    Menopausal symptoms    Pure hypercholesterolemia    Supraventricular tachycardia ([place])    Vitamin D deficiency    Past Surgical History:  Procedure Laterality Date   CESAREAN  SECTION     CHOLECYSTECTOMY N/A 04/28/2016   Procedure: LAPAROSCOPIC CHOLECYSTECTOMY WITH INTRAOPERATIVE CHOLANGIOGRAM;  Surgeon: Excell Seltzer, MD;  Location: [facility];  Service: General;  Laterality: N/A;   GANGLION CYST EXCISION     HISTORY KIDNEY STONES     IR URETERAL STENT PLACEMENT EXISTING ACCESS LEFT  06/22/2018   KIDNEY STONE SURGERY     left and right   LAPAROSCOPY     MOHS SURGERY     FACE   NEPHROLITHOTOMY Left 06/22/2018   Procedure: NEPHROLITHOTOMY PERCUTANEOUS;  Surgeon: Franchot Gallo, MD;  [facility];  Service: Urology;  Laterality: Left;  3 HRS   SVT ABLATION N/A 10/19/2018   Procedure: SVT ABLATION;  Surgeon: Constance Haw, MD;  Location: [facility];  Service: Cardiovascular;  Laterality: N/A;     Current Outpatient Medications  Medication Sig Dispense Refill   Ascorbic Acid (VITAMIN C) 1000 MG tablet Take 1,000 mg by mouth daily.     Boron 3 MG CAPS Take by mouth.     carvedilol (COREG) 6.25 MG tablet TAKE 1 TABLET BY MOUTH 2 TIMES DAILY WITH A MEAL. 180 tablet 0   Cholecalciferol (VITAMIN D-3) 125 MCG (5000 UT) TABS Take 5,000-10,000 Units by mouth daily.      glucosamine-chondroitin 500-400 MG tablet Take 1 tablet by mouth 3 (three) times daily.     Multiple Vitamins-Minerals (LUTEIN-ZEAXANTHIN PO) Take 1 capsule by mouth daily.      NON FORMULARY Take 1  capsule by mouth daily. Plexus Bio Cleanse   Bioflavonoid Complex  (quince, orange peel, lemon peel) - 50mg . Sodium - 50mg . Magnesium - 380mg . Ascorbic Acid (Vitamin C) - 150mg . Bioflavoniod Complex - 50mg .     Nutritional Supplements (FEEDING SUPPLEMENT, VITAL 1.0 CAL,) LIQD Take by mouth.     Omega-3 Fatty Acids (OMEGA 3 PO) Take 1 capsule by mouth daily. PLANT BASED      OVER THE COUNTER MEDICATION      Probiotic Product (PROBIOTIC PO) Take 1 capsule by mouth daily.      Quercetin 500 MG CAPS Take by mouth.     RESVERATROL PO Take by mouth.     TURMERIC-FISH OIL PO Take by mouth.      Zinc 50 MG TABS Take 50 mg by mouth daily.     No current facility-administered medications for this visit.    Allergies:   Epinephrine, Erythromycin base, and Dilaudid [hydromorphone hcl]   Social History:  The patient  reports that she has never smoked. She has never used smokeless tobacco. She reports that she does not drink alcohol and does not use drugs.   Family History:  The patient's family history includes Adrenal disorder in her mother; Alcohol abuse in her maternal grandfather; Alzheimer's disease in her father and paternal grandfather; Arthritis in her father and paternal grandfather; Cancer in her father; Heart attack in her maternal grandmother; Hypertension in her father and paternal grandfather; Kidney cancer in her father; Stroke in her paternal grandfather.   ROS:  Please see the history of present illness.   Otherwise, review of systems is positive for none.   All other systems are reviewed and negative.   PHYSICAL EXAM: VS:  BP 128/68   Pulse (!) 54   Ht 5\' 3"  (1.6 m)   Wt 168 lb 12.8 oz (76.6 kg)   SpO2 98%   BMI 29.90 kg/m  , BMI Body mass index is 29.9 kg/m. GEN: Well nourished, well developed, in no acute distress  HEENT: normal  Neck: no JVD, carotid bruits, or masses Cardiac: RRR; no murmurs, rubs, or gallops,no edema  Respiratory:  clear to auscultation bilaterally, normal work of breathing GI: soft, nontender, nondistended, + BS MS: no deformity or atrophy  Skin: warm and dry Neuro:  Strength and sensation are intact Psych: euthymic mood, full affect  EKG:  EKG is ordered today. Personal review of the ekg ordered shows sinus rhythm, rate 54  Recent Labs: 05/14/2020: ALT 20; BUN 11; Creat 0.69; Hemoglobin 14.6; Platelets 296; Potassium 4.2; Sodium 140; TSH 1.52    Lipid Panel     Component Value Date/Time   CHOL 239 (H) 05/14/2020 1415   TRIG 115 05/14/2020 1415   HDL 43 (L) 05/14/2020 1415   CHOLHDL 5.6 (H) 05/14/2020 1415   LDLCALC 172  (H) 05/14/2020 1415     Wt Readings from Last 3 Encounters:  02/09/21 168 lb 12.8 oz (76.6 kg)  05/14/20 193 lb 3.2 oz (87.6 kg)  01/10/20 204 lb 3.2 oz (92.6 kg)      Other studies Reviewed: Additional studies/ records that were reviewed today include: 30 day monitor 06/22/17 - personally reviewed Sinus rhythm Palpitations associated with SVT appearing atrial tachycardia  ASSESSMENT AND PLAN:  1.  PACs/atrial tachycardia: Found on cardiac monitor.  She was initially on metoprolol but this has since been stopped.  She had a worsening palpitations after her coronavirus infection but they were self resolved.  She has minimal palpitations.  She  is overall comfortable with her control.  She Nicholas Trompeter have her primary physician refilled her carvedilol.  I Equilla Que see her back on an as-needed basis.  2.  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  No obvious recurrence.  3.  Hypertension: Currently well controlled   Current medicines are reviewed at length with the patient today.   The patient does not have concerns regarding her medicines.  The following changes were made today: None  Labs/ tests ordered today include:  Orders Placed This Encounter  Procedures   EKG 12-Lead      Disposition:   FU with Jdyn Parkerson as needed year  Signed, Artisha Capri Meredith Leeds, MD  02/09/2021 10:54 AM     [place] [address] [phone] (office) [phone] (fax)

## 2021-05-03 ENCOUNTER — Other Ambulatory Visit: Payer: Self-pay | Admitting: Cardiology

## 2021-08-01 HISTORY — PX: ABDOMINOPLASTY: SUR9

## 2021-11-04 ENCOUNTER — Other Ambulatory Visit: Payer: Self-pay | Admitting: Cardiology

## 2021-11-04 NOTE — Telephone Encounter (Signed)
ASSESSMENT AND PLAN:   1.  PACs/atrial tachycardia: Found on cardiac monitor.  She was initially on metoprolol but this has since been stopped.  She had a worsening palpitations after her coronavirus infection but they were self resolved.  She has minimal palpitations.  She is overall comfortable with her control.  She will have her primary physician refilled her carvedilol.  I will see her back on an as-needed basis.

## 2021-12-10 DIAGNOSIS — Z124 Encounter for screening for malignant neoplasm of cervix: Secondary | ICD-10-CM | POA: Diagnosis not present

## 2021-12-30 DIAGNOSIS — R87615 Unsatisfactory cytologic smear of cervix: Secondary | ICD-10-CM | POA: Diagnosis not present

## 2022-01-13 DIAGNOSIS — Z1231 Encounter for screening mammogram for malignant neoplasm of breast: Secondary | ICD-10-CM | POA: Diagnosis not present

## 2022-04-19 ENCOUNTER — Encounter: Payer: Self-pay | Admitting: Neurology

## 2022-04-19 ENCOUNTER — Telehealth: Payer: Self-pay | Admitting: Neurology

## 2022-04-19 ENCOUNTER — Ambulatory Visit (INDEPENDENT_AMBULATORY_CARE_PROVIDER_SITE_OTHER): Payer: 59 | Admitting: Neurology

## 2022-04-19 VITALS — BP 140/83 | HR 67 | Ht 63.0 in | Wt 173.4 lb

## 2022-04-19 DIAGNOSIS — E348 Other specified endocrine disorders: Secondary | ICD-10-CM | POA: Diagnosis not present

## 2022-04-19 DIAGNOSIS — R4189 Other symptoms and signs involving cognitive functions and awareness: Secondary | ICD-10-CM | POA: Diagnosis not present

## 2022-04-19 DIAGNOSIS — Z820 Family history of epilepsy and other diseases of the nervous system: Secondary | ICD-10-CM

## 2022-04-19 DIAGNOSIS — Z789 Other specified health status: Secondary | ICD-10-CM

## 2022-04-19 NOTE — Telephone Encounter (Signed)
Referral for Neuropsychology fax to [facility] as requested by neurologist,.  Phone: [phone], Fax: [phone]

## 2022-04-19 NOTE — Patient Instructions (Addendum)
"The XX Brain" by Eden Emms The Healthy Brain study at [place] from [place] "Older" medications for alzheimers includes aricept/donepezil and memantine/namenda - not time yet for this NEWER medication Lecanemab: stop amyloid from forming in the brain and takes it out - not time yet for this Dr. Sima Matas for formal memory testing could be considered at this point And we have other blood tests that look for tau and amyloid in the blood, CT scans to look for amyloid in the brain and we have FDG PET Scan but at this time I would hold off  Recommendations to prevent or slow progression of cognitive decline:   Exercise You should increase exercise 30 to 45 minutes per day at least 3 days a week although 5 to 7 would be preferred. Any type of exercise (including walking) is acceptable although a recumbent bicycle may be best if you are unsteady. Disease related apathy can be a significant roadblock to exercise and the only way to overcome this is to make it a daily routine and perhaps have a reward at the end (something your loved one loves to eat or drink perhaps) or a personal trainer coming to the home can also be very useful. In general a structured, repetitive schedule is best.   Cardiovascular Health: You should optimize all cardiovascular risk factors (blood pressure, sugar, cholesterol) as vascular disease such as strokes and heart attacks can make memory problems much worse.   Diet: Eating a heart healthy (Mediterranean) diet is also a good idea; fish and poultry instead of red meat, nuts (mostly non-peanuts), vegetables, fruits, olive oil or canola oil (instead of butter), minimal salt (use other spices to flavor foods), whole grain rice, bread, cereal and pasta and wine in moderation.  General Health: Any diseases which effect your body will effect your brain such as a pneumonia, urinary infection, blood clot, heart attack or stroke. Keep contact with your primary care doctor for  regular follow ups.  Sleep. A good nights sleep is healthy for the brain. Seven hours is recommended. If you have insomnia or poor sleep habits see the recommendations below  Tips: Structured and consistent daytime and nighttime routine, including regular wake times, bedtimes, and mealtimes, will be important for the patient to avoid confusion. Keeping frequently used items in designated places will help reduce stress from searching. If there are worries about getting lost do not let the patient leave home unaccompanied. They might benefit from wearing an identification bracelet that will help others assist in finding home if they become lost. Information about nationwide safe return services and other helpful resources may be obtained through the Alzheimer's Association helpline at [phone].  Finances, Power of Producer, television/film/video Directives: You should consider putting legal safeguards in place with regard to financial and medical decision making. While the spouse always has power of attorney for medical and financial issues in the absence of any form, you should consider what you want in case the spouse / caregiver is no longer around or capable of making decisions.   [place] : http://www.welch.com/.pdf  Or Google "[place]" AND "An Forensic scientist for [employer]  Other States: ApartmentMom.com.ee  The signature on these forms should be notarized.   RESOURCES:  Memory Loss: Improve your short term memory By Silvio Pate  The Alzheimer's Reading Room http://www.alzheimersreadingroom.com/   The Alzheimer's Compendium http://www.alzcompend.info/  [employer] www.dukefamilysupport.Radonna Ricker 302-081-2470  Recommended resources for caregivers (All can be purchased on [place]):  1) A  Caregiver's Guide to Dementia: Using  Activities and Other Strategies to Prevent, Reduce and Manage Behavioral Symptoms by Osie Bond. Tyler Aas and [employer]   2) A Caregiver's Guide to [employer] Dementia by Caleen Essex MS BSN and Gaston Islam   3) What If It's Not Alzheimer's?: A Caregiver's Guide to Dementia by Koren Shiver (Author), Octaviano Batty (Editor)  3) The 36 hour day by Rabins and Mace  4) Understanding Difficult Behaviors by Merita Norton and White  Online course for helping caregivers reduce stress, guilt and frustration called the Caregivers Helpbook. The website is www.powerfultoolsforcaregivers.org  As a caregiver you are a Art gallery manager. Problems you face as a caregiver are usually unique to your situation and the way your loved-one's disease manifests itself. The best way to use these books is to look at the Table of Contents and read any chapters of interest or that apply to challenges you are having as a caregiver.  NATIONAL RESOURCES: For more information on neurological disorders or research programs funded by the [employer] of Neurological Disorders and Stroke, contact the Institute's Agricultural consultant (BRAIN) at: [place] [address] [phone] (toll-free) MasterBoxes.it  Information on dementia is also available from the following organizations: Alzheimer's Disease Education and Referral ([place]) [employer] [facility] [address] [place] [address] [phone] (toll-free) DVDEnthusiasts.nl  Alzheimer's Association [address], [place] [address] [phone] (toll-free, 24-hour helpline) [phone] (TDD) CapitalMile.co.nz  Alzheimer's Foundation of America [address], [place] [address] [phone] (toll-free) www.alzfdn.org  [facility] [place] [address], [place]  [address] [phone] www.alzdiscovery.org  Association for [place] [address], [place] [address] [phone] (toll-free) [place] [address] 609-867-2069 (toll-free) www.brightfocus.org/alzheimers  Doran Stabler [facility] Alzheimer's [facility] [address] [place] [address] [phone] www.https://lambert-jackson.net/  Lewy Body Dementia Association [address], [place] [address] [phone] (toll-free LBD Caregiver Link) www.lbda.[place], [place], [place] [address] 260-319-8502 (toll-free) 657-628-2794 [facility]) https://carter.com/  National Organization for Rare Disorders [address] [place] [address] 4-765-465-KPTW [phone]) (toll-free) www.rarediseases.org  The Dementias: Hope Through Research was jointly produced by the [employer] of Neurological Disorders and Stroke (NINDS) and the [employer] on Aging (NIA), both part of the [employer], the [employer] research agency--supporting scientific studies that turn discovery into health. NINDS is the nation's leading funder of research on the brain and nervous system. The NINDS mission is to reduce the burden of neurological disease. For more information and resources, visit MasterBoxes.it [1] or call 332-020-8082. NIA leads the federal government effort conducting and supporting research on aging and the health and well-being of older people. NIA's Alzheimer's Disease Education and Referral (ADEAR) Center offers information and publications on dementia and caregiving for families, caregivers, and professionals. For more information, visit DVDEnthusiasts.nl [2] or call (613) [phone]. Also available from NIA are  publications and information about Alzheimer's disease as well as the booklets Frontotemporal Disorders: Information for Patients, Families, and Caregivers and Lewy Body Dementia: Information for Patients, Families, and Professionals. Source URL: SocialSpecialists.co.nz     Memory Compensation Strategies  Use "WARM" strategy.  W= write it down  A= associate it  R= repeat it  M= make a mental note  2.  You can keep a Social worker.  Use a 3-ring notebook with sections for the following: calendar, important names and phone numbers,  medications, doctors' names/phone numbers, lists/reminders, and a section to journal what you did  each day.   3.    Use a calendar to write appointments down.  4.    Write yourself a schedule for the day.  This can be placed on the calendar or in a separate section of the Memory Notebook.  Keeping a  regular schedule can help memory.  5.    Use medication organizer with sections for each day or morning/evening pills.  You may need help loading it  6.    Keep a basket, or pegboard by the door.  Place items that you need to take out with you in the basket or on the pegboard.  You may also want to  include a message board for reminders.  7.    Use sticky notes.  Place sticky notes with reminders in a place where the task is performed.  For example: " turn off the  stove" placed by the stove, "lock the door" placed on the door at eye level, " take your medications" on  the bathroom mirror or by the place where you normally take your medications.  8.    Use alarms/timers.  Use while cooking to remind yourself to check on food or as a reminder to take your medicine, or as a  reminder to make a call, or as a reminder to perform another task, etc.

## 2022-04-19 NOTE — Progress Notes (Signed)
[facility]    Provider:  Dr Jaynee Eagles Requesting Provider: Eunice Blase, MD Primary Care Provider:  Eunice Blase, MD  CC:  FHx of alzheimers, white matter changes on MRI and 47m pineal gland  HPI:  Dana VENTRESCAis a 61y.o. female here as requested by HEunice Blase MD for family history of dementia, white matter changes on brain and pineal cyst.  She has a past medical history of hypertension, diabetes, nephrolithiasis.  I reviewed Dr. HJunius Roadsnotes, she manages her diabetes, but does have some episodes where she eats something wrong and her glucose is not consistently abnormal, no numbness or tingling of the hands of the feet, she has a family history of Alzheimer's in her father her sister and his father, patient has had a testing and had 1 copy of ApoE (4?), she is interested in preventative measures is much as possible.  Last her some of her cardiac testing was abnormal including lipid particle sizes, her father died at 943and had kidney cancer, colon cancer, Alzheimer's and hypertension.  Paternal grandfather had Alzheimer's and died at 872  Paternal aunt died at age 3771of Alzheimer's.  Dr. HJunius Roadsexamination was unremarkable, physical examination.  She is exercising, of course scared of Alzheimer's given significant FHx and one ApoE4. She is being very aggressive with her health and eating and exercising. She is notiing some short-term memory issues, forgetting what she is doing in another room or going to get, sometimes people's names, not getting lost or forgetting bills, no hallucinations or delusions, no one in the family has mentioned anything, her husband passed away 6 years ago and she is not the same person, she gets a little overwhelmed with learning some things, last week she bought a playsey and put it all togetrher and figuredit out and she problem solves, every once in a while when driving she feels overwhelmed, she was concerned on the MRI findings and the  pineal gland and history of alzheimers she wanted a baseline. We discussed multiple preventions, increase of risk with one gene and FHx, we wpoke about tradionally meds and other blood tests. We discussed mri of the brain which I believe is normal for age and medical problems. No other focal neurologic deficits, associated symptoms, inciting events or modifiable factors.   Reviewed notes, labs and imaging from outside physicians, which showed:  02/23/2022: MRI of the brain showed nothing acute, some multifocal T2 flair hyperintense signal within the cerebral white matter overall mild but somewhat advanced for age (I believe that this is in line with her past medical history of diabetes and hypertension), she has an 8 mm pineal cyst otherwise unremarkable.  Hgba1c 5.8 prior 6.7 and 6.9 B2,b12,vit d, homocysteine, tsh normal  05/2020   Review of Systems: Patient complains of symptoms per HPI as well as the following symptoms fhx alzheimers. Pertinent negatives and positives per HPI. All others negative.   Social History   Socioeconomic History   Marital status: Widowed    Spouse name: Not on file   Number of children: Not on file   Years of education: Not on file   Highest education level: Not on file  Occupational History   Not on file  Tobacco Use   Smoking status: Never   Smokeless tobacco: Never  Vaping Use   Vaping Use: Never used  Substance and Sexual Activity   Alcohol use: No   Drug use: No   Sexual activity: Not on file  Other Topics  Concern   Not on file  Social History Narrative   Not on file   Social Determinants of Health   Financial Resource Strain: Not on file  Food Insecurity: Not on file  Transportation Needs: Not on file  Physical Activity: Not on file  Stress: Not on file  Social Connections: Not on file  Intimate Partner Violence: Not on file    Family History  Problem Relation Age of Onset   Adrenal disorder Mother    Hypertension Father     Alzheimer's disease Father    Kidney cancer Father    Arthritis Father    Cancer Father    Arthritis Paternal Grandfather    Hypertension Paternal Grandfather    Stroke Paternal Grandfather    Alzheimer's disease Paternal Grandfather    Alcohol abuse Maternal Grandfather    Heart attack Maternal Grandmother     Past Medical History:  Diagnosis Date   Cancer ([place])    Complication of anesthesia    Difficult to arouse   Diabetes ([place]) 05/14/2020   Diabetes mellitus type II, controlled, with no complications ([place])    Dyspepsia    Dysrhythmia    runs SVT/VT   WITH ANXIETY      Hypertension    Kidney stones    Menopausal symptoms    Pure hypercholesterolemia    Supraventricular tachycardia    Vitamin D deficiency     Patient Active Problem List   Diagnosis Date Noted   Family history of Alzheimer's disease 05/14/2020   Diabetes ([place]) 05/14/2020   Hypertension 05/14/2020   Hyperlipidemia 05/14/2020   Vitamin D deficiency 05/14/2020   History of basal cell cancer 05/14/2020   Nephrolithiasis 06/22/2018   Cholecystitis 04/28/2016    Past Surgical History:  Procedure Laterality Date   ABDOMINOPLASTY  08/2021   CESAREAN SECTION     CHOLECYSTECTOMY N/A 04/28/2016   Procedure: LAPAROSCOPIC CHOLECYSTECTOMY WITH INTRAOPERATIVE CHOLANGIOGRAM;  Surgeon: Excell Seltzer, MD;  Location: [facility];  Service: General;  Laterality: N/A;   GANGLION CYST EXCISION     HISTORY KIDNEY STONES     IR URETERAL STENT PLACEMENT EXISTING ACCESS LEFT  06/22/2018   KIDNEY STONE SURGERY     left and right   LAPAROSCOPY     MOHS SURGERY     FACE   NEPHROLITHOTOMY Left 06/22/2018   Procedure: NEPHROLITHOTOMY PERCUTANEOUS;  Surgeon: Franchot Gallo, MD;  Location: [facility];  Service: Urology;  Laterality: Left;  3 HRS   SVT ABLATION N/A 10/19/2018   Procedure: SVT ABLATION;  Surgeon: Constance Haw, MD;  Location: [facility];  Service: Cardiovascular;  Laterality: N/A;    Current  Outpatient Medications  Medication Sig Dispense Refill   Ascorbic Acid (VITAMIN C PO) Take by mouth.     Ascorbic Acid (VITAMIN C) 1000 MG tablet Take 1,000 mg by mouth daily.     Boron 3 MG CAPS Take by mouth.     carvedilol (COREG) 6.25 MG tablet TAKE 1 TABLET BY MOUTH 2 TIMES DAILY WITH A MEAL. (Patient taking differently: 3.125 mg daily.) 180 tablet 1   Cholecalciferol (VITAMIN D-3) 125 MCG (5000 UT) TABS Take 5,000-10,000 Units by mouth daily.      glucosamine-chondroitin 500-400 MG tablet Take 1 tablet by mouth 3 (three) times daily.     Multiple Vitamins-Minerals (LUTEIN-ZEAXANTHIN PO) Take 1 capsule by mouth daily.      NON FORMULARY Take 1 capsule by mouth daily. Plexus Bio Cleanse   Bioflavonoid Complex  (  quince, orange peel, lemon peel) - '50mg'$ . Sodium - '50mg'$ . Magnesium - '380mg'$ . Ascorbic Acid (Vitamin C) - '150mg'$ . Bioflavoniod Complex - '50mg'$ .     Nutritional Supplements (FEEDING SUPPLEMENT, VITAL 1.0 CAL,) LIQD Take by mouth.     Omega-3 Fatty Acids (OMEGA 3 PO) Take 1 capsule by mouth daily. PLANT BASED      Probiotic Product (PROBIOTIC PO) Take 1 capsule by mouth daily.      Pyridoxine HCl (VITAMIN B-6 PO) Take by mouth.     Quercetin 500 MG CAPS Take by mouth.     RESVERATROL PO Take by mouth.     TURMERIC-FISH OIL PO Take by mouth.     VITAMIN A PO Take by mouth.     VITAMIN D PO Take by mouth.     VITAMIN E PO Take by mouth.     Zinc 50 MG TABS Take 50 mg by mouth daily.     No current facility-administered medications for this visit.    Allergies as of 04/19/2022 - Review Complete 04/19/2022  Allergen Reaction Noted   Epinephrine     Erythromycin base     Dilaudid [hydromorphone hcl] Other (See Comments) 04/28/2016    Vitals: BP (!) 140/83   Pulse 67   Ht '5\' 3"'$  (1.6 m)   Wt 173 lb 6.4 oz (78.7 kg)   BMI 30.72 kg/m  Last Weight:  Wt Readings from Last 1 Encounters:  04/19/22 173 lb 6.4 oz (78.7 kg)   Last Height:   Ht Readings from Last 1 Encounters:   04/19/22 '5\' 3"'$  (1.6 m)     Physical exam: Exam: Gen: NAD, conversant, well nourised, obese, well groomed                     CV: RRR, no MRG. No Carotid Bruits. No peripheral edema, warm, nontender Eyes: Conjunctivae clear without exudates or hemorrhage  Neuro: Detailed Neurologic Exam  Speech:    Speech is normal; fluent and spontaneous with normal comprehension.  Cognition:    The patient is oriented to person, place, and time;     recent and remote memory intact;     language fluent;     normal attention, concentration,     fund of knowledge Cranial Nerves:     04/19/2022   10:35 AM  Montreal Cognitive Assessment   Visuospatial/ Executive (0/5) 4  Naming (0/3) 3  Attention: Read list of digits (0/2) 2  Attention: Read list of letters (0/1) 1  Attention: Serial 7 subtraction starting at 100 (0/3) 3  Language: Repeat phrase (0/2) 1  Language : Fluency (0/1) 1  Abstraction (0/2) 2  Delayed Recall (0/5) 1  Orientation (0/6) 6  Total 24       The pupils are equal, round, and reactive to light. The fundi are normal and spontaneous venous pulsations are present. Visual fields are full to finger confrontation. Extraocular movements are intact. Trigeminal sensation is intact and the muscles of mastication are normal. The face is symmetric. The palate elevates in the midline. Hearing intact. Voice is normal. Shoulder shrug is normal. The tongue has normal motion without fasciculations.   Coordination: nml  Gait: nml  Motor Observation:    No asymmetry, no atrophy, and no involuntary movements noted. Tone:    Normal muscle tone.    Posture:    Posture is normal. normal erect    Strength:    Strength is V/V in the upper and lower limbs.  Sensation: intact to LT     Reflex Exam:  DTR's:    Deep tendon reflexes in the upper and lower extremities are normal bilaterally.   Toes:    The toes are downgoing bilaterally.   Clonus:    Clonus is absent.     Assessment/Plan:  Lovely patient with FHx of alzheimers, white matter changes on MRI and 64m pineal gland, one copy of ApoE4. I do not appreciate any neurodegenerative disease in this patient but I understand her concern and we discussed dementia and her risk and some recommendations:  "The XX Brain" by LEden EmmsThe Healthy Brain study at [place]from [place]"Older" medications for alzheimers includes aricept/donepezil and memantine/namenda - not time yet for this NEWER medication Lecanemab: stop amyloid from forming in the brain and takes it out - not time yet for this Dr. RSima Matasfor formal memory testing could be considered And we have other blood tests that look for tau and amyloid in the blood, CT scans to look for amyloid in the brain and we have FDG PET Scan but at this time I would hold off MRI brain: normal for age and co-morbidities Can repeat MRI brain one year to ensure pineal cyst is stable    Recommendations to prevent or slow progression of cognitive decline:  discussed:   Exercise You should increase exercise 30 to 45 minutes per day at least 3 days a week although 5 to 7 would be preferred. Any type of exercise (including walking) is acceptable although a recumbent bicycle may be best if you are unsteady. Disease related apathy can be a significant roadblock to exercise and the only way to overcome this is to make it a daily routine and perhaps have a reward at the end (something your loved one loves to eat or drink perhaps) or a personal trainer coming to the home can also be very useful. In general a structured, repetitive schedule is best.   Cardiovascular Health: You should optimize all cardiovascular risk factors (blood pressure, sugar, cholesterol) as vascular disease such as strokes and heart attacks can make memory problems much worse.   Diet: Eating a heart healthy (Mediterranean) diet is also a good idea; fish and poultry instead of red meat, nuts  (mostly non-peanuts), vegetables, fruits, olive oil or canola oil (instead of butter), minimal salt (use other spices to flavor foods), whole grain rice, bread, cereal and pasta and wine in moderation.  General Health: Any diseases which effect your body will effect your brain such as a pneumonia, urinary infection, blood clot, heart attack or stroke. Keep contact with your primary care doctor for regular follow ups.  Sleep. A good nights sleep is healthy for the brain. Seven hours is recommended. If you have insomnia or poor sleep habits see the recommendations below  Tips: Structured and consistent daytime and nighttime routine, including regular wake times, bedtimes, and mealtimes, will be important for the patient to avoid confusion. Keeping frequently used items in designated places will help reduce stress from searching. If there are worries about getting lost do not let the patient leave home unaccompanied. They might benefit from wearing an identification bracelet that will help others assist in finding home if they become lost. Information about nationwide safe return services and other helpful resources may be obtained through the Alzheimer's Association helpline at 1800-7347278519.  Finances, Power of AProducer, television/film/videoDirectives: You should consider putting legal safeguards in place with regard to financial and medical decision  making. While the spouse always has power of attorney for medical and financial issues in the absence of any form, you should consider what you want in case the spouse / caregiver is no longer around or capable of making decisions.   [place] : http://www.welch.com/.pdf  Or Google "Irena" AND "An Forensic scientist for [employer]  Other States: ApartmentMom.com.ee  The signature on these forms should be notarized.    DRIVING:   Driving only during the day Drive only to familiar Locations Avoid driving during bad weather  If you would like to be tested to see if you are driving safely, [facility] has a Clinical Driving Evaluation. To schedule an appointment call [phone].                RESOURCES:  Memory Loss: Improve your short term memory By Silvio Pate  The Alzheimer's Reading Room http://www.alzheimersreadingroom.com/   The Alzheimer's Compendium http://www.alzcompend.info/  [employer] www.dukefamilysupport.GHW [phone]  Recommended resources for caregivers (All can be purchased on [employer]):  1) A Caregiver's Guide to Dementia: Using Activities and Other Strategies to Prevent, Reduce and Manage Behavioral Symptoms by Osie Bond. Tyler Aas and [employer]   2) A Caregiver's Guide to [employer] Dementia by Caleen Essex MS BSN and Gaston Islam   3) What If It's Not Alzheimer's?: A Caregiver's Guide to Dementia by Koren Shiver (Author), Octaviano Batty (Editor)  3) The 36 hour day by Rabins and Mace  4) Understanding Difficult Behaviors by Merita Norton and White  Online course for helping caregivers reduce stress, guilt and frustration called the Caregivers Helpbook. The website is www.powerfultoolsforcaregivers.org  As a caregiver you are a Art gallery manager. Problems you face as a caregiver are usually unique to your situation and the way your loved-one's disease manifests itself. The best way to use these books is to look at the Table of Contents and read any chapters of interest or that apply to challenges you are having as a caregiver.  NATIONAL RESOURCES: For more information on neurological disorders or research programs funded by the [employer] of Neurological Disorders and Stroke, contact the Institute's Agricultural consultant (BRAIN) at: [place] [address] [phone]  (toll-free) MasterBoxes.it  Information on dementia is also available from the following organizations: Alzheimer's Disease Education and Referral ([place]) [place] [facility] [address] [place] [address] [phone] (toll-free) DVDEnthusiasts.nl  Alzheimer's Association [address] [place] [address] [phone] (toll-free, 24-hour helpline) [phone] (TDD) CapitalMile.co.nz  Alzheimer's Foundation of America [address], [place] [address] [phone] (toll-free) www.alzfdn.org  Alzheimer's Drug Susan Moore [address], [place] [address] [phone] www.alzdiscovery.org  Association for [place] [address], [place] [address] [phone] (toll-free) www.theaftd[place] [address] [phone] (toll-free) www.brightfocus.org/alzheimers  Doran Stabler [facility] Alzheimer's [facility] [address] [place] [address] [phone] www.https://lambert-jackson.net/  Lewy Body Dementia Association [address] [place] [address] [phone] 6804173733 (toll-free LBD Caregiver Link) www.lbda.[place], [place], [place] [address] 6031560771 (toll-free) 2146240240 [facility]) https://carter.com/  National Organization for Rare Disorders [address] [place] [address] 9-935-701-XBLT [phone]) (toll-free) www.rarediseases.org  The Dementias: Hope Through Research was jointly produced by the [employer] of Neurological Disorders and Stroke (NINDS) and the [employer] on  Aging (NIA), both part of the [employer], the nation's medical research agency--supporting scientific studies that turn  discovery into health. NINDS is the nation's leading funder of research on the brain and nervous system. The NINDS mission is to reduce the burden of neurological disease. For more information and resources, visit MasterBoxes.it [1] or call ([phone]. NIA leads the federal government effort conducting and supporting research on aging and the health and well-being of older people. NIA's Alzheimer's Disease Education and Referral (ADEAR) Center offers information and publications on dementia and caregiving for families, caregivers, and professionals. For more information, visit DVDEnthusiasts.nl [2] or call [phone]. Also available from NIA are publications and information about Alzheimer's disease as well as the booklets Frontotemporal Disorders: Information for Patients, Families, and Caregivers and Lewy Body Dementia: Information for Patients, Families, and Professionals. Source URL: SocialSpecialists.co.nz     Orders Placed This Encounter  Procedures   Ambulatory referral to Neuropsychology   No orders of the defined types were placed in this encounter.   Cc: Eunice Blase, MD,  Eunice Blase, MD  Sarina Ill, MD  [facility] [address] [place], [place] [address]  Phone [phone] Fax [phone]  I spent 60 minutes of face-to-face and non-face-to-face time with patient on the  1. FHx: Alzheimer's disease   2. 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 Pineal gland cyst   4. Subjective memory complaints    diagnosis.  This included previsit chart review, lab review, study review, order entry, electronic health record documentation, patient education on the different diagnostic and therapeutic options, counseling and coordination of care, risks and benefits of management, compliance, or risk factor reduction

## 2022-04-20 ENCOUNTER — Encounter: Payer: Self-pay | Admitting: Psychology

## 2022-12-07 ENCOUNTER — Encounter: Payer: 59 | Admitting: Psychology

## 2023-08-24 ENCOUNTER — Other Ambulatory Visit: Payer: Self-pay | Admitting: Obstetrics and Gynecology

## 2023-08-24 DIAGNOSIS — Z13820 Encounter for screening for osteoporosis: Secondary | ICD-10-CM

## 2023-08-24 DIAGNOSIS — Z1231 Encounter for screening mammogram for malignant neoplasm of breast: Secondary | ICD-10-CM

## 2023-11-18 ENCOUNTER — Other Ambulatory Visit: Payer: Self-pay | Admitting: Family Medicine

## 2023-11-18 DIAGNOSIS — G8929 Other chronic pain: Secondary | ICD-10-CM

## 2023-11-24 ENCOUNTER — Other Ambulatory Visit: Payer: Self-pay

## 2023-11-24 ENCOUNTER — Emergency Department (HOSPITAL_COMMUNITY)

## 2023-11-24 ENCOUNTER — Encounter (HOSPITAL_COMMUNITY): Payer: Self-pay | Admitting: Emergency Medicine

## 2023-11-24 ENCOUNTER — Emergency Department (HOSPITAL_COMMUNITY)
Admission: EM | Admit: 2023-11-24 | Discharge: 2023-11-24 | Disposition: A | Discharge status: Home / Self Care | Attending: Emergency Medicine | Admitting: Emergency Medicine

## 2023-11-24 DIAGNOSIS — R1084 Generalized abdominal pain: Secondary | ICD-10-CM | POA: Diagnosis present

## 2023-11-24 DIAGNOSIS — N132 Hydronephrosis with renal and ureteral calculous obstruction: Secondary | ICD-10-CM | POA: Insufficient documentation

## 2023-11-24 DIAGNOSIS — N200 Calculus of kidney: Secondary | ICD-10-CM

## 2023-11-24 LAB — URINALYSIS, ROUTINE W REFLEX MICROSCOPIC
Bilirubin Urine: NEGATIVE
Glucose, UA: NEGATIVE mg/dL
Hgb urine dipstick: NEGATIVE
Ketones, ur: 20 mg/dL — AB
Leukocytes,Ua: NEGATIVE
Nitrite: NEGATIVE
Protein, ur: NEGATIVE mg/dL
Specific Gravity, Urine: 1.012 (ref 1.005–1.030)
pH: 8 (ref 5.0–8.0)

## 2023-11-24 LAB — BASIC METABOLIC PANEL WITH GFR
Anion gap: 11 (ref 5–15)
BUN: 22 mg/dL (ref 8–23)
CO2: 25 mmol/L (ref 22–32)
Calcium: 9.2 mg/dL (ref 8.9–10.3)
Chloride: 102 mmol/L (ref 98–111)
Creatinine, Ser: 0.75 mg/dL (ref 0.44–1.00)
GFR, Estimated: >60 mL/min (ref >60)
Glucose, Bld: 187 mg/dL — ABNORMAL HIGH (ref 70–99)
Potassium: 3.8 mmol/L (ref 3.5–5.1)
Sodium: 138 mmol/L (ref 135–145)

## 2023-11-24 LAB — CBC
HCT: 47.3 % — ABNORMAL HIGH (ref 36.0–46.0)
Hemoglobin: 15.4 g/dL — ABNORMAL HIGH (ref 12.0–15.0)
MCH: 28.7 pg (ref 26.0–34.0)
MCHC: 32.6 g/dL (ref 30.0–36.0)
MCV: 88.1 fL (ref 80.0–100.0)
Platelets: 260 K/uL (ref 150–400)
RBC: 5.37 MIL/uL — ABNORMAL HIGH (ref 3.87–5.11)
RDW: 13.5 % (ref 11.5–15.5)
WBC: 8.4 K/uL (ref 4.0–10.5)
nRBC: 0 % (ref 0.0–0.2)

## 2023-11-24 MED ORDER — ONDANSETRON HCL 4 MG/2ML IJ SOLN
4.0000 mg | Freq: Once | INTRAMUSCULAR | Status: AC
  Administered 2023-11-24: 4 mg via INTRAVENOUS
  Filled 2023-11-24: qty 2

## 2023-11-24 MED ORDER — MORPHINE SULFATE (PF) 4 MG/ML IV SOLN
4.0000 mg | Freq: Once | INTRAVENOUS | Status: AC
  Administered 2023-11-24: 4 mg via INTRAVENOUS
  Filled 2023-11-24: qty 1

## 2023-11-24 MED ORDER — KETOROLAC TROMETHAMINE 30 MG/ML IJ SOLN
15.0000 mg | Freq: Once | INTRAMUSCULAR | Status: AC
  Administered 2023-11-24: 15 mg via INTRAVENOUS
  Filled 2023-11-24: qty 1

## 2023-11-24 MED ORDER — OXYCODONE-ACETAMINOPHEN 5-325 MG PO TABS: 1.0000 | ORAL_TABLET | Freq: Four times a day (QID) | ORAL | 0 refills | Status: AC | PRN

## 2023-11-24 MED ORDER — FENTANYL CITRATE PF 50 MCG/ML IJ SOSY
75.0000 ug | PREFILLED_SYRINGE | Freq: Once | INTRAMUSCULAR | Status: AC
  Administered 2023-11-24: 75 ug via INTRAVENOUS
  Filled 2023-11-24: qty 2

## 2023-11-24 MED ORDER — TAMSULOSIN HCL 0.4 MG PO CAPS
0.4000 mg | ORAL_CAPSULE | Freq: Every day | ORAL | 0 refills | Status: AC
Start: 2023-11-24 — End: ?

## 2023-11-24 MED ORDER — OXYCODONE-ACETAMINOPHEN 5-325 MG PO TABS
1.0000 | ORAL_TABLET | Freq: Once | ORAL | Status: AC
  Administered 2023-11-24: 1 via ORAL
  Filled 2023-11-24: qty 1

## 2023-11-24 MED ORDER — OXYCODONE-ACETAMINOPHEN 5-325 MG PO TABS
1.0000 | ORAL_TABLET | Freq: Four times a day (QID) | ORAL | 0 refills | Status: DC | PRN
Start: 2023-11-24 — End: 2023-11-24

## 2023-11-24 MED ORDER — TAMSULOSIN HCL 0.4 MG PO CAPS: 0.4000 mg | ORAL_CAPSULE | Freq: Every day | ORAL | 0 refills | Status: DC

## 2023-11-24 NOTE — ED Triage Notes (Signed)
 Pt with 10/10 right flank pain and lower abdominal pain.  Pt states she has had multiple surgeries for kidney stones in the past.  One episode of vomiting today.  Hypertensive in triage

## 2023-11-24 NOTE — Discharge Instructions (Addendum)
 Return to the ER if you develop new or worsening or uncontrolled pain, vomiting, fever, urinary tract infection symptoms, or any other new/concerning symptoms.

## 2023-11-24 NOTE — ED Notes (Signed)
 Pt unable to provide urine at this time

## 2023-11-24 NOTE — ED Provider Notes (Signed)
 Patient care transferred to me.  Urine is unremarkable, not consistent with infection.  Patient and daughter are [facility] [place] [facility] [place]  Otherwise, pain seems controlled enough for discharge, will give return precautions and recommend follow-up with urology.   Freddi Hamilton, MD 11/24/23 [phone]

## 2023-11-24 NOTE — ED Provider Notes (Signed)
 [place] [facility]GENCY DEPARTMENT AT [facility] Ed Provider Note   CSN: [id number] Arrival date & time: 11/24/23  1341     Patient presents with: Flank Pain   Dana Townsend is a 63 y.o. female.   63 year old female with history of renal colic in the past presents with sudden onset lower abdominal pain which is now to her right side.  States that when she has kidney stones she normally does not have pain like this.  She had 1 episode of nausea vomiting.  No fever.  No dysuria or hematuria.  No vaginal bleeding or discharge.  Pain is been persistent for several hours.  No treatment use prior to arrival       Prior to Admission medications   Medication Sig Start Date End Date Taking? Authorizing Provider  Ascorbic Acid (VITAMIN C PO) Take by mouth.    [provider]  Ascorbic Acid (VITAMIN C) 1000 MG tablet Take 1,000 mg by mouth daily.    [provider]  Boron 3 MG CAPS Take by mouth.    [provider]  carvedilol  (COREG ) 6.25 MG tablet TAKE 1 TABLET BY MOUTH 2 TIMES DAILY WITH A MEAL. Patient taking differently: 3.125 mg daily. 05/05/21   Camnitz, Soyla Lunger, MD  Cholecalciferol (VITAMIN D -3) 125 MCG (5000 UT) TABS Take 5,000-10,000 Units by mouth daily.     [provider]  glucosamine-chondroitin 500-400 MG tablet Take 1 tablet by mouth 3 (three) times daily.    [provider]  Multiple Vitamins-Minerals (LUTEIN-ZEAXANTHIN PO) Take 1 capsule by mouth daily.     [provider]  NON FORMULARY Take 1 capsule by mouth daily. Plexus Bio Cleanse   Bioflavonoid Complex  (quince, orange peel, lemon peel) - 50mg . Sodium - 50mg . Magnesium - 380mg . Ascorbic Acid (Vitamin C) - 150mg . Bioflavoniod Complex - 50mg .    [provider]  Nutritional Supplements (FEEDING SUPPLEMENT, VITAL 1.0 CAL,) LIQD Take by mouth.    [provider]  Omega-3 Fatty Acids (OMEGA 3 PO) Take 1 capsule by mouth daily. PLANT  BASED     [provider]  Probiotic Product (PROBIOTIC PO) Take 1 capsule by mouth daily.     [provider]  Pyridoxine HCl (VITAMIN B-6 PO) Take by mouth.    [provider]  Quercetin 500 MG CAPS Take by mouth.    [provider]  RESVERATROL PO Take by mouth.    [provider]  TURMERIC-FISH OIL PO Take by mouth.    [provider]  VITAMIN A PO Take by mouth.    [provider]  VITAMIN D  PO Take by mouth.    [provider]  VITAMIN E PO Take by mouth.    [provider]  Zinc  50 MG TABS Take 50 mg by mouth daily.    [provider]    Allergies: Epinephrine, Erythromycin base, and Dilaudid  [hydromorphone  hcl]    Review of Systems  All other systems reviewed and are negative.   Updated Vital Signs BP (!) 201/116 (BP Location: Left Arm)   Pulse 63   Temp 97.8 F (36.6 C) (Oral)   Resp 20   SpO2 100%   Physical Exam Vitals and nursing note reviewed.  Constitutional:      General: She is not in acute distress.    Appearance: Normal appearance. She is well-developed. She is not toxic-appearing.  HENT:     Head: Normocephalic and atraumatic.  Eyes:  General: Lids are normal.     Conjunctiva/sclera: Conjunctivae normal.     Pupils: Pupils are equal, round, and reactive to light.  Neck:     Thyroid : No thyroid  mass.     Trachea: No tracheal deviation.  Cardiovascular:     Rate and Rhythm: Normal rate and regular rhythm.     Heart sounds: Normal heart sounds. No murmur heard.    No gallop.  Pulmonary:     Effort: Pulmonary effort is normal. No respiratory distress.     Breath sounds: Normal breath sounds. No stridor. No decreased breath sounds, wheezing, rhonchi or rales.  Abdominal:     General: There is no distension.     Palpations: Abdomen is soft.     Tenderness: There is generalized abdominal tenderness. There is right CVA tenderness. There is no guarding or rebound.   Musculoskeletal:        General: No tenderness. Normal range of motion.     Cervical back: Normal range of motion and neck supple.  Skin:    General: Skin is warm and dry.     Findings: No abrasion or rash.  Neurological:     Mental Status: She is alert and oriented to person, place, and time. Mental status is at baseline.     GCS: GCS eye subscore is 4. GCS verbal subscore is 5. GCS motor subscore is 6.     Cranial Nerves: No cranial nerve deficit.     Sensory: No sensory deficit.     Motor: Motor function is intact.  Psychiatric:        Attention and Perception: Attention normal.        Speech: Speech normal.        Behavior: Behavior normal.     (all labs ordered are listed, but only abnormal results are displayed) Labs Reviewed  URINALYSIS, ROUTINE W REFLEX MICROSCOPIC  BASIC METABOLIC PANEL WITH GFR  CBC    EKG: None  Radiology: No results found.   Procedures   Medications Ordered in the ED  morphine  (PF) 4 MG/ML injection 4 mg (has no administration in time range)  ondansetron  (ZOFRAN ) injection 4 mg (has no administration in time range)                                    Medical Decision Making Amount and/or Complexity of Data Reviewed Labs: ordered. Radiology: ordered.  Risk Prescription drug management.   Patient given 2 rounds of pain medication.  Given Toradol .  CT shows 4 mm kidney stone.  Patient's pain is controlled this time.  Urinalysis is pending.  Patient to follow-up with her urologist and care turned over to [facility]     Final diagnoses:  None    ED Discharge Orders     None          Dasie Faden, MD 11/24/23 1650

## 2023-12-21 ENCOUNTER — Ambulatory Visit (HOSPITAL_BASED_OUTPATIENT_CLINIC_OR_DEPARTMENT_OTHER)
Admission: RE | Admit: 2023-12-21 | Discharge: 2023-12-21 | Disposition: A | Discharge status: Home / Self Care | Source: Ambulatory Visit | Attending: Obstetrics and Gynecology | Admitting: Obstetrics and Gynecology

## 2023-12-21 DIAGNOSIS — Z780 Asymptomatic menopausal state: Secondary | ICD-10-CM | POA: Diagnosis not present

## 2023-12-21 DIAGNOSIS — Z13820 Encounter for screening for osteoporosis: Secondary | ICD-10-CM | POA: Insufficient documentation

## 2024-05-10 ENCOUNTER — Ambulatory Visit
Admission: RE | Admit: 2024-05-10 | Discharge: 2024-05-10 | Disposition: A | Discharge status: Home / Self Care | Source: Ambulatory Visit | Attending: Obstetrics and Gynecology | Admitting: Obstetrics and Gynecology

## 2024-05-10 ENCOUNTER — Other Ambulatory Visit

## 2024-05-10 DIAGNOSIS — Z1231 Encounter for screening mammogram for malignant neoplasm of breast: Secondary | ICD-10-CM
